# Patient Record
Sex: Male | Born: 1970 | Smoking: Never smoker
Health system: Southern US, Community
[De-identification: ages and names within clinical notes are randomized; demographics above are authoritative.]

## PROBLEM LIST (undated history)

## (undated) DIAGNOSIS — I639 Cerebral infarction, unspecified: Secondary | ICD-10-CM

## (undated) DIAGNOSIS — I5020 Unspecified systolic (congestive) heart failure: Secondary | ICD-10-CM

## (undated) DIAGNOSIS — Z95810 Presence of automatic (implantable) cardiac defibrillator: Secondary | ICD-10-CM

## (undated) DIAGNOSIS — G4700 Insomnia, unspecified: Secondary | ICD-10-CM

## (undated) DIAGNOSIS — E119 Type 2 diabetes mellitus without complications: Secondary | ICD-10-CM

## (undated) DIAGNOSIS — I10 Essential (primary) hypertension: Secondary | ICD-10-CM

## (undated) DIAGNOSIS — N183 Chronic kidney disease, stage 3 unspecified: Secondary | ICD-10-CM

## (undated) DIAGNOSIS — F419 Anxiety disorder, unspecified: Secondary | ICD-10-CM

## (undated) DIAGNOSIS — Z91199 Patient's noncompliance with other medical treatment and regimen due to unspecified reason: Secondary | ICD-10-CM

## (undated) DIAGNOSIS — I429 Cardiomyopathy, unspecified: Secondary | ICD-10-CM

## (undated) DIAGNOSIS — I517 Cardiomegaly: Secondary | ICD-10-CM

## (undated) DIAGNOSIS — I5042 Chronic combined systolic (congestive) and diastolic (congestive) heart failure: Secondary | ICD-10-CM

## (undated) DIAGNOSIS — L709 Acne, unspecified: Secondary | ICD-10-CM

## (undated) DIAGNOSIS — R0602 Shortness of breath: Secondary | ICD-10-CM

## (undated) DIAGNOSIS — Z9119 Patient's noncompliance with other medical treatment and regimen: Secondary | ICD-10-CM

## (undated) DIAGNOSIS — Z8739 Personal history of other diseases of the musculoskeletal system and connective tissue: Secondary | ICD-10-CM

## (undated) DIAGNOSIS — R519 Headache, unspecified: Secondary | ICD-10-CM

## (undated) HISTORY — DX: Chronic kidney disease, stage 3 unspecified: N18.30

## (undated) HISTORY — DX: Acne, unspecified: L70.9

## (undated) HISTORY — DX: Chronic combined systolic (congestive) and diastolic (congestive) heart failure: I50.42

## (undated) HISTORY — DX: Essential (primary) hypertension: I10

## (undated) HISTORY — DX: Unspecified systolic (congestive) heart failure: I50.20

## (undated) HISTORY — DX: Insomnia, unspecified: G47.00

## (undated) HISTORY — DX: Patient's noncompliance with other medical treatment and regimen due to unspecified reason: Z91.199

## (undated) HISTORY — DX: Presence of automatic (implantable) cardiac defibrillator: Z95.810

## (undated) HISTORY — DX: Personal history of other diseases of the musculoskeletal system and connective tissue: Z87.39

## (undated) HISTORY — DX: Cardiomyopathy, unspecified: I42.9

## (undated) HISTORY — DX: Cardiomegaly: I51.7

## (undated) HISTORY — DX: Shortness of breath: R06.02

## (undated) HISTORY — DX: Anxiety disorder, unspecified: F41.9

## (undated) HISTORY — DX: Type 2 diabetes mellitus without complications: E11.9

## (undated) HISTORY — DX: Patient's noncompliance with other medical treatment and regimen: Z91.19

---

## 2017-06-25 DIAGNOSIS — N183 Chronic kidney disease, stage 3 unspecified: Secondary | ICD-10-CM | POA: Insufficient documentation

## 2017-06-25 DIAGNOSIS — I10 Essential (primary) hypertension: Secondary | ICD-10-CM | POA: Insufficient documentation

## 2017-06-25 DIAGNOSIS — Z8739 Personal history of other diseases of the musculoskeletal system and connective tissue: Secondary | ICD-10-CM | POA: Insufficient documentation

## 2017-06-25 DIAGNOSIS — E119 Type 2 diabetes mellitus without complications: Secondary | ICD-10-CM | POA: Insufficient documentation

## 2017-07-29 DIAGNOSIS — I517 Cardiomegaly: Secondary | ICD-10-CM | POA: Insufficient documentation

## 2017-07-29 DIAGNOSIS — Z91199 Patient's noncompliance with other medical treatment and regimen due to unspecified reason: Secondary | ICD-10-CM | POA: Insufficient documentation

## 2017-07-29 DIAGNOSIS — Z9119 Patient's noncompliance with other medical treatment and regimen: Secondary | ICD-10-CM | POA: Insufficient documentation

## 2017-07-29 DIAGNOSIS — R0602 Shortness of breath: Secondary | ICD-10-CM | POA: Insufficient documentation

## 2017-08-19 DIAGNOSIS — I429 Cardiomyopathy, unspecified: Secondary | ICD-10-CM | POA: Insufficient documentation

## 2017-08-19 DIAGNOSIS — I5022 Chronic systolic (congestive) heart failure: Secondary | ICD-10-CM | POA: Insufficient documentation

## 2018-01-27 DIAGNOSIS — I5042 Chronic combined systolic (congestive) and diastolic (congestive) heart failure: Secondary | ICD-10-CM | POA: Insufficient documentation

## 2018-03-10 ENCOUNTER — Ambulatory Visit: Payer: Self-pay | Admitting: "Endocrinology

## 2018-04-05 ENCOUNTER — Ambulatory Visit: Payer: Self-pay | Admitting: "Endocrinology

## 2018-06-02 ENCOUNTER — Ambulatory Visit: Payer: Self-pay | Admitting: "Endocrinology

## 2020-05-09 HISTORY — PX: OTHER SURGICAL HISTORY: SHX169

## 2020-06-18 ENCOUNTER — Encounter: Payer: Self-pay | Admitting: *Deleted

## 2020-06-18 DIAGNOSIS — L708 Other acne: Secondary | ICD-10-CM | POA: Insufficient documentation

## 2020-06-18 DIAGNOSIS — I10 Essential (primary) hypertension: Secondary | ICD-10-CM | POA: Insufficient documentation

## 2020-06-18 DIAGNOSIS — F419 Anxiety disorder, unspecified: Secondary | ICD-10-CM | POA: Insufficient documentation

## 2020-06-18 DIAGNOSIS — G4700 Insomnia, unspecified: Secondary | ICD-10-CM | POA: Insufficient documentation

## 2020-06-21 ENCOUNTER — Institutional Professional Consult (permissible substitution): Payer: Self-pay | Admitting: Student

## 2020-08-14 NOTE — Progress Notes (Unsigned)
Electrophysiology Office Note Date: 08/15/2020  ID:  Phillip Ruiz, DOB 05/17/1971, MRN [id number]  PCP: No primary care provider on file. Primary Cardiologist: [facility] Electrophysiologist: New  CC: Routine ICD follow-up  Phillip Ruiz is a 50 y.o. male seen today for consultation for Barostim Consideration. The patient reports doing overall OK. He is SOB with ADLs including dressing and bathing. He has moderate SOB walking around a pharmacy or a grocery store. Volume status overall well controlled.  he denies chest pain, palpitations, PND, nausea, vomiting, dizziness, syncope, weight gain, or early satiety. He has not had ICD shocks.   He was last seen at [facility] 08/07/20. Entresto was increased. He was described as having NYHA class II dyspnea. Spironolactone and SGLT2 inhibitors are being considered based on follow up labwork.   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 allopurinol (ZYLOPRIM) 100 MG tablet Take 200 mg by mouth daily.    Marland Kitchen amLODipine (NORVASC) 10 MG tablet Take 10 mg by mouth daily.    Marland Kitchen aspirin 81 MG EC tablet Take 81 mg by mouth daily.    . calcitRIOL (ROCALTROL) 0.5 MCG capsule Take 0.25 mcg by mouth in the morning and at bedtime.    . carvedilol (COREG) 25 MG tablet Take 25 mg by mouth in the morning and at bedtime.    Marland Kitchen ENTRESTO 49-51 MG Take 1 tablet by mouth 2 (two) times daily.    . ergocalciferol (VITAMIN D2) 1.25 MG (50000 UT) capsule Vitamin D2 1,250 mcg (50,000 unit) capsule  TAKE 1 CAPSULE BY MOUTH WEEKLY    . minoxidil (LONITEN) 2.5 MG tablet 205 mg in the morning and at bedtime.    . Multiple Vitamin (MULTIVITAMIN PO) Take  1,000 Units by mouth daily.    Marland Kitchen torsemide (DEMADEX) 20 MG tablet Take 20 mg by mouth in the morning and at bedtime.     No current facility-administered medications for this visit.    Allergies:   Patient has no known allergies.   Social History: Social History   Socioeconomic History  . Marital status: Divorced    Spouse name: Not on file  . Number of children: Not on file  . Years of education: Not on file  . Highest education level: Not on file  Occupational History  . Not on file  Tobacco Use  . Smoking status: Never Smoker  . Smokeless tobacco: Never Used  Substance and Sexual Activity  . Alcohol use: Not on file  . Drug use: Not on file  . Sexual activity: Not on file  Other Topics Concern  . Not on file  Social History Narrative  . Not on file   Social Determinants of Health   Financial Resource Strain: Not on file  Food Insecurity: Not on file  Transportation Needs: Not on file  Physical Activity: Not on file  Stress: Not on file  Social Connections: Not on file  Intimate Partner Violence: Not on file    Family History: Father with strokes Mother with Heart Failure   Review of Systems: All other systems reviewed and are otherwise negative except as noted above.   Physical Exam: Vitals:   08/15/20 1248  BP: 132/84  Pulse:  73  SpO2: 97%  Weight: 196 lb 6.4 oz (89.1 kg)  Height: 5\' 8"  (1.727 m)     GEN- The patient is well appearing, alert and oriented x 3 today.   HEENT: normocephalic, atraumatic; sclera clear, conjunctiva pink; hearing intact; oropharynx clear; neck supple, no JVP Lymph- no cervical lymphadenopathy Lungs- Clear to ausculation bilaterally, normal work of breathing.  No wheezes, rales, rhonchi Heart- Regular rate and rhythm, no murmurs, rubs or gallops, PMI not laterally displaced GI- soft, non-tender, non-distended, bowel sounds present, no hepatosplenomegaly Extremities- no clubbing or cyanosis. No edema; DP/PT/radial pulses  2+ bilaterally MS- no significant deformity or atrophy Skin- warm and dry, no rash or lesion; ICD pocket well healed Psych- euthymic mood, full affect Neuro- strength and sensation are intact  EKG:  EKG is ordered today. The ekg ordered today shows NSR at 73 bpm with QRS 94 ms  Recent Labs: No results found for requested labs within last 8760 hours.   Wt Readings from Last 3 Encounters:  08/15/20 196 lb 6.4 oz (89.1 kg)     Other studies Reviewed: Additional studies/ records that were reviewed today include: Notes in care everywhere from [facility] cardiology.    Assessment and Plan:  1.  Chronic systolic dysfunction s/p [facility] dual chamber ICD  euvolemic today Stable on current regimen, and primary cardiologist is actively titrating GDMT.  Normal ICD function Note plans for repeat Echo Stress test 06/2017 with no ischemia, low EF, stopped due to dyspnea.   2. CKD III Has limited medications to a degree.  If spironolactone is deferred due to this, he will still be able to proceed with Barostim consideration.   Phillip Ruiz's heart failure has failed to improve despite titration of guideline directed medication such that he qualifies for the [facility] NEO device. The following information from the patient's medical record supports the medical necessity of this procedure for my patient, consistent with the FDA on-label indication for BAROSTIM NEO:  ? LVEF of 25-30%  confirmed by Echo on 03/2018. Note recent note with plans to update s/p GDMT.    ? NT-proBNP of <1600 pg/ml  = Labs to be drawn today, 08/14/20  ?Symptomatic despite medication management of: diuretic, beta blocker and ACEi/ARB/ARNi as evidenced by symptoms below. He is being considered for spironolactone and SGLT2 inhibitor based on follow up labs.   ? This patients signs and symptoms of heart failure include "dyspnea with mild to moderate exertion, orthopnea, edema, fatigue and dizziness   ?  NYHA Congestive Heart Failure Classification: III  ? Recent hospitalization for Heart Failure on (not applicable for this patient)   This patient is NOT indicated for cardiac resynchronization therapy because QRS = 94 ms by EKG 08/15/20    Current medicines are reviewed at length with the patient today.   The patient does not have concerns regarding his medicines.  The following changes were made today:  none  Labs/ tests ordered today include:  Orders Placed This Encounter  Procedures  . Basic metabolic panel  . Pro b natriuretic peptide (BNP)  . EKG 12-Lead   Pt needs in order to proceed with barostim consideration: GDMT optimization (mainly trial of spiro OR decision to forego spiro in the setting of his kidney disease) Repeat echo once on GDMT ProBNP (ordered today, but if out of range can be repeated post GDMT). Carotid assessment (If he is not excluded by repeat Echo or ProBNP)    Disposition:   Follow up  with EP APP  4-6 weeks for further barostim consideration and planning.     Dustin Flock, PA-C  08/15/2020 1:28 PM  [facility] [address] [place] [address] [phone] (office) [phone] (fax)

## 2020-08-15 ENCOUNTER — Encounter: Payer: Self-pay | Admitting: Student

## 2020-08-15 ENCOUNTER — Other Ambulatory Visit: Payer: Self-pay

## 2020-08-15 ENCOUNTER — Ambulatory Visit (INDEPENDENT_AMBULATORY_CARE_PROVIDER_SITE_OTHER): Payer: Medicare Other | Admitting: Student

## 2020-08-15 VITALS — BP 132/84 | HR 73 | Ht 68.0 in | Wt 196.4 lb

## 2020-08-15 DIAGNOSIS — I5022 Chronic systolic (congestive) heart failure: Secondary | ICD-10-CM | POA: Diagnosis not present

## 2020-08-15 DIAGNOSIS — I10 Essential (primary) hypertension: Secondary | ICD-10-CM

## 2020-08-15 DIAGNOSIS — N183 Chronic kidney disease, stage 3 unspecified: Secondary | ICD-10-CM

## 2020-08-15 NOTE — Patient Instructions (Signed)
Medication Instructions:  Your physician recommends that you continue on your current medications as directed. Please refer to the Current Medication list given to you today.  *If you need a refill on your cardiac medications before your next appointment, please call your pharmacy*   Lab Work: TODAY: BMET, ProBNP  If you have labs (blood work) drawn today and your tests are completely normal, you will receive your results only by: Marland Kitchen MyChart Message (if you have MyChart) OR . A paper copy in the mail If you have any lab test that is abnormal or we need to change your treatment, we will call you to review the results.   Follow-Up: At [facility], you and your health needs are our priority.  As part of our continuing mission to provide you with exceptional heart care, we have created designated Provider Care Teams.  These Care Teams include your primary Cardiologist (physician) and Advanced Practice Providers (APPs -  Physician Assistants and Nurse Practitioners) who all work together to provide you with the care you need, when you need it.  We recommend signing up for the patient portal called "MyChart".  Sign up information is provided on this After Visit Summary.  MyChart is used to connect with patients for Virtual Visits (Telemedicine).  Patients are able to view lab/test results, encounter notes, upcoming appointments, etc.  Non-urgent messages can be sent to your provider as well.   To learn more about what you can do with MyChart, go to [email or web address].    Your next appointment:   09/12/2020  The format for your next appointment:   In Person  Provider:   Casimiro Needle "Mardelle Matte" Lanna Poche, PA-C   Other Instructions Your Dr's are considering trying you on SPIRONOLACTONE

## 2020-08-16 LAB — BASIC METABOLIC PANEL
BUN/Creatinine Ratio: 10 (ref 9–20)
BUN: 16 mg/dL (ref 6–24)
CO2: 22 mmol/L (ref 20–29)
Calcium: 10 mg/dL (ref 8.7–10.2)
Chloride: 104 mmol/L (ref 96–106)
Creatinine, Ser: 1.66 mg/dL — ABNORMAL HIGH (ref 0.76–1.27)
GFR calc Af Amer: 54 mL/min/{1.73_m2} — ABNORMAL LOW (ref >59)
GFR calc non Af Amer: 47 mL/min/{1.73_m2} — ABNORMAL LOW (ref >59)
Glucose: 99 mg/dL (ref 65–99)
Potassium: 3.9 mmol/L (ref 3.5–5.2)
Sodium: 144 mmol/L (ref 134–144)

## 2020-08-16 LAB — PRO B NATRIURETIC PEPTIDE: NT-Pro BNP: 560 pg/mL — ABNORMAL HIGH (ref 0–121)

## 2020-09-12 ENCOUNTER — Telehealth: Payer: Medicare Other | Admitting: Student

## 2020-09-24 ENCOUNTER — Other Ambulatory Visit: Payer: Self-pay

## 2020-09-24 ENCOUNTER — Telehealth (INDEPENDENT_AMBULATORY_CARE_PROVIDER_SITE_OTHER): Payer: Medicare Other | Admitting: Student

## 2020-09-24 DIAGNOSIS — I5022 Chronic systolic (congestive) heart failure: Secondary | ICD-10-CM

## 2020-09-24 NOTE — Progress Notes (Signed)
Virtual Visit via Telephone Note   This visit type was conducted due to national recommendations for restrictions regarding the COVID-19 Pandemic (e.g. social distancing) in an effort to limit this patient's exposure and mitigate transmission in our community.  Due to his co-morbid illnesses, this patient is at least at moderate risk for complications without adequate follow up.  This format is felt to be most appropriate for this patient at this time.  The patient did not have access to video technology/had technical difficulties with video requiring transitioning to audio format only (telephone).  All issues noted in this document were discussed and addressed.  No physical exam could be performed with this format.  Please refer to the patient's chart for his  consent to telehealth for [facility].  The patient was identified using 2 identifiers.   PCP:  No primary care provider on file. Primary Cardiologist: [facility]. Electrophysiologist: New to [facility] pending barostim procedure.  Phillip Ruiz is a 50 y.o. male seen today for ongoing consultation for barostim consideration for routine follow up to continue discussing plan for barostim.  Since last being seen in our clinic the patient reports doing overall well, and about the same. He remains SOB with moderate exertion, including SOB walking around a grocery store.  he denies chest pain, palpitations, PND, orthopnea, nausea, vomiting, dizziness, syncope, edema, weight gain, or early satiety.  Past Medical History:  Diagnosis Date  . Acne    Nec  . Anxiety   . Cardiomyopathy (HCC)   . Chronic combined systolic and diastolic congestive heart failure, NYHA class 3 (HCC)   . CKD (chronic kidney disease), stage III (HCC)   . DM type 2 (diabetes mellitus, type 2) (HCC)   . HFrEF (heart failure with reduced ejection fraction) (HCC)   . History of gout   . Hypertension, essential, benign   . ICD (implantable  cardioverter-defibrillator) in place   . Insomnia    Nec  . LVH (left ventricular hypertrophy)   . Poor compliance   . SOB (shortness of breath) on exertion   . Uncontrolled hypertension      Current Outpatient Medications  Medication Sig Dispense Refill  . allopurinol (ZYLOPRIM) 100 MG tablet Take 200 mg by mouth daily.    Marland Kitchen amLODipine (NORVASC) 10 MG tablet Take 10 mg by mouth daily.    Marland Kitchen aspirin 81 MG EC tablet Take 81 mg by mouth daily.    . calcitRIOL (ROCALTROL) 0.5 MCG capsule Take 0.25 mcg by mouth in the morning and at bedtime.    . carvedilol (COREG) 25 MG tablet Take 25 mg by mouth in the morning and at bedtime.    Marland Kitchen ENTRESTO 49-51 MG Take 1 tablet by mouth 2 (two) times daily.    . ergocalciferol (VITAMIN D2) 1.25 MG (50000 UT) capsule Vitamin D2 1,250 mcg (50,000 unit) capsule  TAKE 1 CAPSULE BY MOUTH WEEKLY    . minoxidil (LONITEN) 2.5 MG tablet 205 mg in the morning and at bedtime.    . Multiple Vitamin (MULTIVITAMIN PO) Take 1,000 Units by mouth daily.    Marland Kitchen torsemide (DEMADEX) 20 MG tablet Take 20 mg by mouth in the morning and at bedtime.     No current facility-administered medications for this visit.    No Known Allergies  Social History   Socioeconomic History  . Marital status: Divorced    Spouse name: Not on file  . Number of children: Not on file  . Years  of education: Not on file  . Highest education level: Not on file  Occupational History  . Not on file  Tobacco Use  . Smoking status: Never Smoker  . Smokeless tobacco: Never Used  Vaping Use  . Vaping Use: Never used  Substance and Sexual Activity  . Alcohol use: Never  . Drug use: Never  . Sexual activity: Not on file  Other Topics Concern  . Not on file  Social History Narrative  . Not on file   Social Determinants of Health   Financial Resource Strain: Not on file  Food Insecurity: Not on file  Transportation Needs: Not on file  Physical Activity: Not on file  Stress: Not on file   Social Connections: Not on file  Intimate Partner Violence: Not on file   Review of Systems: All other systems reviewed and are otherwise negative except as noted above.  Physical Exam: Pt unable to take vitals today.  GEN- The patient is well appearing, alert and oriented x 3 today.   HEENT: normocephalic, atraumatic; sclera clear, conjunctiva pink; hearing intact; oropharynx clear; neck supple, no JVP Lymph- no cervical lymphadenopathy Lungs- Clear to ausculation bilaterally, normal work of breathing.  No wheezes, rales, rhonchi Heart- Regular rate and rhythm, no murmurs, rubs or gallops, PMI not laterally displaced GI- soft, non-tender, non-distended, bowel sounds present, no hepatosplenomegaly Extremities- no clubbing, cyanosis, or edema; DP/PT/radial pulses 2+ bilaterally MS- no significant deformity or atrophy Skin- warm and dry, no rash or lesion Psych- euthymic mood, full affect Neuro- strength and sensation are intact  EKG is not ordered.   Additional studies reviewed include: Previous EP notes and CareEverywhere  Assessment and Plan:  1. Chronic systolic CHF Pts GDMT has been titrated and plans for repeat Echo per primary team in 11/13/2020. We will follow up by phone again after this.  If EF remains < 35%, will proceed with barostim work up. If >40% will have to defer for the time being. Discussed with patient today.   Graciella Freer, PA-C  09/24/20 9:36 AM

## 2020-11-19 ENCOUNTER — Other Ambulatory Visit: Payer: Self-pay

## 2020-11-19 ENCOUNTER — Telehealth (INDEPENDENT_AMBULATORY_CARE_PROVIDER_SITE_OTHER): Payer: Medicare Other | Admitting: Student

## 2020-11-19 DIAGNOSIS — N183 Chronic kidney disease, stage 3 unspecified: Secondary | ICD-10-CM

## 2020-11-19 DIAGNOSIS — I5022 Chronic systolic (congestive) heart failure: Secondary | ICD-10-CM | POA: Diagnosis not present

## 2020-11-19 NOTE — Progress Notes (Addendum)
Virtual Visit via Telephone Note   This visit type was conducted due to national recommendations for restrictions regarding the COVID-19 Pandemic (e.g. social distancing) in an effort to limit this patient's exposure and mitigate transmission in our community.  Due to his co-morbid illnesses, this patient is at least at moderate risk for complications without adequate follow up.  This format is felt to be most appropriate for this patient at this time.  The patient did not have access to video technology/had technical difficulties with video requiring transitioning to audio format only (telephone).  All issues noted in this document were discussed and addressed.  No physical exam could be performed with this format.  Please refer to the patient's chart for his  consent to telehealth for [facility].  The patient was identified using 2 identifiers.   PCP:  No primary care provider on file. Primary Cardiologist: [facility]. Electrophysiologist: New to [facility] pending barostim procedure.  Provider location: [facility] Patient location: His home  Phillip Ruiz is a 50 y.o. male seen today for ongoing consultation for barostim consideration.  Since last visit, pt had medications titrated and had a repeat Echo that shows EF remains low at 20-25% range.  He does pretty well "for the first half of the day" and then pretty wiped out for the rest of the day.  He continues to have issues with his blood pressure running high. Recently started back on amlodipine. BP running as high as 180 systolic and 110s diastolic at times. He has mild SOB doing anything more than just "walking around the house." He denies chest pains, palpitations, PND, orthopnea, nausea, vomiting, dizziness, syncope, or edema.  He is very interested in moving forward with anything that could make him feel better.   Past Medical History:  Diagnosis Date  . Acne    Nec  . Anxiety   .  Cardiomyopathy (HCC)   . Chronic combined systolic and diastolic congestive heart failure, NYHA class 3 (HCC)   . CKD (chronic kidney disease), stage III (HCC)   . DM type 2 (diabetes mellitus, type 2) (HCC)   . HFrEF (heart failure with reduced ejection fraction) (HCC)   . History of gout   . Hypertension, essential, benign   . ICD (implantable cardioverter-defibrillator) in place   . Insomnia    Nec  . LVH (left ventricular hypertrophy)   . Poor compliance   . SOB (shortness of breath) on exertion   . Uncontrolled hypertension      Current Outpatient Medications  Medication Sig Dispense Refill  . allopurinol (ZYLOPRIM) 100 MG tablet Take 200 mg by mouth daily.    Marland Kitchen amLODipine (NORVASC) 10 MG tablet Take 10 mg by mouth daily.    Marland Kitchen aspirin 81 MG EC tablet Take 81 mg by mouth daily.    . calcitRIOL (ROCALTROL) 0.5 MCG capsule Take 0.25 mcg by mouth in the morning and at bedtime.    . carvedilol (COREG) 25 MG tablet Take 25 mg by mouth in the morning and at bedtime.    Marland Kitchen ENTRESTO 49-51 MG Take 1 tablet by mouth 2 (two) times daily.    . ergocalciferol (VITAMIN D2) 1.25 MG (50000 UT) capsule Vitamin D2 1,250 mcg (50,000 unit) capsule  TAKE 1 CAPSULE BY MOUTH WEEKLY    . minoxidil (LONITEN) 2.5 MG tablet 205 mg in the morning and at bedtime.    . Multiple Vitamin (MULTIVITAMIN PO) Take 1,000 Units by mouth daily.    Marland Kitchen  torsemide (DEMADEX) 20 MG tablet Take 20 mg by mouth in the morning and at bedtime.     No current facility-administered medications for this visit.    No Known Allergies  Social History   Socioeconomic History  . Marital status: Divorced    Spouse name: Not on file  . Number of children: Not on file  . Years of education: Not on file  . Highest education level: Not on file  Occupational History  . Not on file  Tobacco Use  . Smoking status: Never Smoker  . Smokeless tobacco: Never Used  Vaping Use  . Vaping Use: Never used  Substance and Sexual Activity   . Alcohol use: Never  . Drug use: Never  . Sexual activity: Not on file  Other Topics Concern  . Not on file  Social History Narrative  . Not on file   Social Determinants of Health   Financial Resource Strain: Not on file  Food Insecurity: Not on file  Transportation Needs: Not on file  Physical Activity: Not on file  Stress: Not on file  Social Connections: Not on file  Intimate Partner Violence: Not on file   Review of Systems: Review of systems complete and found to be negative unless listed in HPI.    Exam: BP at home running as high as 160-180s at times. Diastolic values 90-110s at times. Amlodipine recently added back   Well sounding on phone. Answers questions and holds conversation without any notable distress or SOB.   EKG is not ordered. EKG 08/15/20 with NSR at 73 bpm, QRS 93 ms  Additional studies reviewed include: Previous EP notes and CareEverywhere again reviewed with updated Echo 10/2020  Assessment and Plan:  1. Chronic systolic CHF Echo 11/13/2020 with LVEF 20-25% range Pts GDMT has been titrated and EF remains <35%.  Pt continues with NYHA III symptoms and is thus candidate for consideration of neurohormonal modulation with Barostim or enrollment in [place] or ANTHEM.  Pt is on coreg and Entresto, and spiro is being avoided due to renal function I have recommended he follow up with his CHF team to consider BiDIL as well. I believe he would benefit from assessment by our research team to consolidate his options and work up as to not require multiple trips down to [place] from [place].  He has a narrow QRS and is not a candidate for CRT therapy.  2. CKD III This has precluded consideration of spironolactone at this time.  Bidil would also be a great option for this patient.   Will send to research for consideration of BATWIRE, commercial, or other depending on candidacy.   I spent 12 minutes with this patient over the telephone discussing the above.  Graciella Freer, PA-C  11/19/20 11:22 AM

## 2020-11-28 ENCOUNTER — Telehealth: Payer: Self-pay | Admitting: *Deleted

## 2020-11-28 DIAGNOSIS — I5022 Chronic systolic (congestive) heart failure: Secondary | ICD-10-CM

## 2020-11-28 DIAGNOSIS — Z006 Encounter for examination for normal comparison and control in clinical research program: Secondary | ICD-10-CM

## 2020-11-28 NOTE — Telephone Encounter (Signed)
Patient interested in [place] study.  Will set up patient for appt, he will check his schedule and call back.

## 2020-12-12 ENCOUNTER — Other Ambulatory Visit: Payer: Self-pay | Admitting: *Deleted

## 2020-12-12 DIAGNOSIS — Z006 Encounter for examination for normal comparison and control in clinical research program: Secondary | ICD-10-CM

## 2020-12-17 NOTE — Telephone Encounter (Signed)
Patietn called back will move forward with screening process.  Pt to come in 12/18/2020 @ 1030 ICF has been emailed to patient along with website information.   Orders placed.

## 2020-12-18 ENCOUNTER — Other Ambulatory Visit: Payer: Self-pay

## 2020-12-18 ENCOUNTER — Other Ambulatory Visit: Payer: Self-pay | Admitting: *Deleted

## 2020-12-18 ENCOUNTER — Ambulatory Visit (HOSPITAL_BASED_OUTPATIENT_CLINIC_OR_DEPARTMENT_OTHER)
Admission: RE | Admit: 2020-12-18 | Discharge: 2020-12-18 | Disposition: A | Discharge status: Home / Self Care | Payer: Medicare Other | Source: Ambulatory Visit | Attending: Internal Medicine | Admitting: Internal Medicine

## 2020-12-18 ENCOUNTER — Ambulatory Visit (HOSPITAL_COMMUNITY)
Admission: RE | Admit: 2020-12-18 | Discharge: 2020-12-18 | Disposition: A | Discharge status: Home / Self Care | Payer: Medicare Other | Source: Ambulatory Visit | Attending: Internal Medicine | Admitting: Internal Medicine

## 2020-12-18 ENCOUNTER — Encounter: Payer: Medicare Other | Admitting: *Deleted

## 2020-12-18 VITALS — BP 146/97 | HR 78 | Temp 98.4°F | Resp 16 | Ht 68.0 in | Wt 190.0 lb

## 2020-12-18 DIAGNOSIS — Z006 Encounter for examination for normal comparison and control in clinical research program: Secondary | ICD-10-CM

## 2020-12-18 DIAGNOSIS — N189 Chronic kidney disease, unspecified: Secondary | ICD-10-CM | POA: Insufficient documentation

## 2020-12-18 DIAGNOSIS — E1122 Type 2 diabetes mellitus with diabetic chronic kidney disease: Secondary | ICD-10-CM | POA: Insufficient documentation

## 2020-12-18 DIAGNOSIS — I5022 Chronic systolic (congestive) heart failure: Secondary | ICD-10-CM | POA: Insufficient documentation

## 2020-12-18 DIAGNOSIS — Z95810 Presence of automatic (implantable) cardiac defibrillator: Secondary | ICD-10-CM | POA: Insufficient documentation

## 2020-12-18 DIAGNOSIS — Z01810 Encounter for preprocedural cardiovascular examination: Secondary | ICD-10-CM | POA: Insufficient documentation

## 2020-12-18 NOTE — Research (Addendum)
Section A:  Administrative Section  Subject ID: _[id number]_ - _021_ - 015 Subject Initials: _V_ _A_ _H_  Date subject signed informed consent  _21_/_JUN_/_2022_  (DD / MMM / YYYY)  Has the subject been previously screened?    _0  No     _1   Yes                                                         Previous Subject ID _1 _2 _4 _5_ - __ __ __ - 015   Section B:  Enrollment Criteria  In the investigator's opinion does the subject meet the FDA Indication for Use:  _2  Yes    _3  No (STOP - subject does not qualify for the study)  Has the subject been previously, or are currently, randomized in the CVRx BeAT-HF Trial? _4  Yes (STOP - subject does not qualify for the study)   _5  No  Has the subject received cardiac resynchronization therapy (CRT) within six months of enrollment, or is actively receiving CRT? _6  Yes (STOP - subject does not qualify for the study)   _7  No  Section C:  Serum Biomarkers   NT-proBNP: ____914____________ Date:    _21_/_JUN_/_2022_              (DD / MMM / YYYY)  eGFR:  __54____ mL/min/1.76m Date:    _21_/_JUN_/_2022_             (DD / MMM / YYYY)  Negative Pregnancy Test? _8  N/A Date:    _21_/_JUN_/_2022_             (DD / MMM / YYYY)   _9  Yes     _10  No   Section D:  Appropriate Surgical/Study Candidate:   Is the subject able to discontinue the use of antiplatelet drugs (e.g. aspirin) in advance of the procedure, if required? _11  Yes   _12  No  Assessed by:  _Annamarie Major MD__ Implanting Physician _13  Yes Date:    _21_/_JUN_/_2022_             (DD / MMM / YYYY)   _14  No   Section E:  Inclusion/Exclusion Criteria  Does the subject meet all Inclusion Criteria? _15  Yes   _16  No (STOP - subject does not qualify for the study)   Check all that apply   _17  Age at least 21 years and no more than 80 years at the time of enrollment.   _18  Appropriate candidate for the surgery as determined by an evaluation from the implanting physician using a carotid duplex ultrasound  (CDU) [or Computed Tomography Angiography (CTA) if CDU inconclusive or incomplete] and a review of medical history (including existence of infections that may increase implant risk).  Evaluation must confirm the following within 45 days of the BAROSTIM NEO implant: Appropriate medical condition and medical history for implantation of the BAROSTIM NEO System AND Anatomy that enables this implant procedure, with no vascular structures or orientations or neck anomalies that would be obstructive to the implantation path AND The artery planned for the BAROSTIM NEO implant must have: A carotid bifurcation below the level of the mandible AND No ulcerative carotid arterial plaques AND No carotid atherosclerosis producing a 30% or greater stenosis in linear diameter in the internal carotid AND No carotid atherosclerosis producing a 30% or greater  stenosis in linear diameter in the distal common carotid AND Have had no prior surgery, radiation, or endovascular stent placement in the carotid artery or the carotid sinus region AND Able to discontinue the use of antiplatelet drugs (e.g. aspirin) in advance of the procedure, if required   _0  Six-minute hall walk (6MHW) ? 150 m AND ? 400 m within 45 days prior to implant.   _1  Serum estimated glomerular filtration rate (eGFR) ? 25 mL/min/1.73 m2 using the CKD-EPI method within 45 days prior to the [facility] NEO implant.   _2  Body mass index ? 40 kg/m2 within 45 days prior to the [facility] NEO implant.   _3  If male and of childbearing potential, must use a medically accepted method of birth control (e.g., barrier method with spermicide, oral contraceptive, or abstinence) and agree to continue use of this method for the duration of the study. Women of childbearing potential must have a negative pregnancy test within 14 days prior to the [facility] NEO implant.   _4  Subjects implanted with a cardiac rhythm management device that does not utilize an intracardiac lead, or  implanted with a neurostimulation device, must be approved by the [place].   _5  At the end of screening/baseline, subject still meets the Barostim Neo Indication for Use   _6  Signed a CVRx-approved informed consent form for participation in this study.      Does the subject meet any Exclusion Criteria? _7  Yes (STOP - subject does not qualify for the study)   _8  No     List criteria # met: __________________________   _9  Previously or currently randomized in the CVRx BeAT-HF Trial.   _10  Received cardiac resynchronization therapy (CRT) within six months of enrollment or is actively receiving CRT.   _11  Any of the following contraindications: Baroreflex failure or autonomic neuropathy  Uncontrolled, symptomatic cardiac bradyarrhythmias Known allergy to silicone or titanium   _12  Unstable ventricular arrhythmias.   _13  Presence of baseline cranial nerve dysfunction at risk from cervical interventions on the carotid bifurcation determined by the Ear, Nose and Throat (ENT) examination.   _14  Subjects with any surgery that has occurred, or is planned to occur, within 45 days of the [facility] NEO implant.   _15  Recent history (within 6 months of implant) of significant and uncontrolled bleeding.   _16  Known and untreated hypercoagulability state.   _17  An inappropriate study candidate as evidenced by: Solid organ or hematologic transplant, or currently being evaluated for an organ transplant. Has received or is receiving LVAD therapy or chronic dialysis. Current or planned treatment with intravenous positive inotrope therapy. Primary pulmonary hypertension. Severe COPD or severe restrictive lung disease (e.g. requires chronic oral steroid use or home oxygen use).  Heart failure secondary to a reversible cause, such as cardiac structural valvular disease, acute myocarditis and pericardial constriction. Clinically significant cardiac structural valvular disease.  Unable or unwilling to  fulfill the protocol medication compliance and follow-up requirements, for reasons including but not limited to an unresolved history of alcohol or substance abuse or psychiatric disorder. Active malignancy.  Any other serious medical condition that may adversely affect the safety of the participant or validity of the study, in the opinion of the investigator. Life expectancy less than one year.   _18  Any of the following within 3 months prior to the [facility] NEO implant. Myocardial infarction Unstable angina Coronary intervention (e.g. CABG or PTCA)  Cerebral vascular accident or transient ischemic attack Sudden cardiac death  Surgical cardiac intervention (e.g. cardiac  ablation, valve replacement)   _0  Enrolled and active in another (e.g. device, pharmaceutical, or biological) clinical study unless approved by the [place].  Section F:  Adverse Events  Were there any Adverse Events that occurred since consent? _1  Yes (complete AE form)   _2  No  Section H:  Medication Changes   Have there been any changes to the subject's home use medications for Arrhythmia, Antiplatelet/Anticoagulation and Heart failure medications during screening and baseline? _3  Yes (update Med form)   _4  No  Section I:  Signature   Person completing form (Print) Name: [name] :)________________  Signature: [name] :) __ Date: __06/24/2022________________________    SECTION A:  Administrative Section  Patient ID: _[id number]_ - _021_ - 015 Patient Initials: _V_ _A_ _H_  Visit Interval:    _5  Screening/Baseline*    _6  Activation   _7  0.5 Month       _8  1 Month     _9  2 Month     _10  3 Month       _11  6 Month     _12  12 Month         _13  Unscheduled, reason for visit: _________________________________________  * For Screening / Baseline only the questions are based on 30 days prior to consent.  SECTION B:  COVID-19 Like Illness Symptoms   Has the subject experienced any cold, flu or  COVID-19 symptoms since the last study visit? _14  No  (skip to section C)     _15  Yes, date of onset:  _____/_____ MMM/YYYY    If yes, check all symptoms that apply:   _16  Fevers or chills   _17  New or Worsening Cough  _18  Productive  _19  Dry    _20  If yes to cough, indicate severity  _21  Constant  _22  Occasional, several per hour   _23  New or worsened shortness of breath   _24  Diarrhea   _25  Altered or reduced sense of smell or taste   _26  Muscle aches/Severe Fatigue   _27  Chest pain or tightness   _28  Sore throat   _29  Nausea or vomiting  SECTION C:  COVID-19 Like Illness Testing   Has the patient been tested for COVID-19 since the last study visit? _30  No     _31  Yes, date of test:  _____/_____ MMM/YYYY    If yes, test results:   _32  Positive _33  Negative _34  Unknown  Has the patient been tested for COVID-19 Antibodies since the last study visit? _35  No     _36  Yes, date of test:  _____/_____ MMM/YYYY    If yes, test results:   _37  Positive _38  Negative _39  Unknown  Has the patient been vaccinated for COVID-19 since the last study visit? _40  No     _41  Yes, date of test:   _28_ /_Apr_/_2021_ DD/MMM/YYYY  Has the patient been tested for Influenza ("flu") since the last study visit? _42  No     _43  Yes, date of test:  _____/_____ MMM/YYYY    If yes, test results:   _44  Positive _45  Negative _46  Unknown  Has the patient been vaccinated for Influenza ("flu") since the last study visit? _47  No     _48  Yes, date of test:   _10_ / _Nov_/_2021_ DD/MMM/YYYY  SECTION D:  COVID-19 Like Illness Exposure  Has the subject been told that they might have had COVID-19/have symptoms suggestive of COVID-19 since the last study visit? _49  No   _50  Yes   _51  Unknown  Has  the subject been exposed to anyone with known or suspected COVID-19 since the last study visit? _0  No   _1  Yes   _2  Unknown  Has the subject been told that they might have had the "flu" or influenza since the last study visit? _3  No   _4  Yes   _5   Unknown  SECTION E:  Effects of COVID Pandemic on Subject's Healthcare Interactions  Since the last study visit, did the subject feel the need to go to an emergency department or hospital for their heart failure but decided not to because of concerns about COVID-19? _6  No   _7  Yes   _8  Unknown    If yes, how did they seek care (check all that apply):   _9  Telemedicine visit _10  In-person _11  Clinic _12  Urgent Care   _13  Subject did not change hospital/ER use due to COVID-19 _14  Other, specify: ________________________  Since the last study visit, did the subject have a cardiology/HF related appointment cancelled/rescheduled due to COVID-19 pandemic? _15  No   _16  Yes, how many? ___________   _17  Unknown  Since the last study visit, did the subject have any cardiology/HF related telemedicine visit due to COVID-19 pandemic? _18  No   _19  Yes, how many? ___________   _20  Unknown  Since the last study visit, did the subject have a cardiology/HF procedure cancelled/rescheduled due to COVID-19 pandemic? _21  No   _22  Yes, how many? ___________   _23  Unknown  SECTION F:  Effects of COVID Pandemic on Subject's Medications  Since the last visit, did any of their heart failure medications change or stop, even for a short time?   If yes, enter change into medication eCRF _24  No   _25  Yes, how many? ___________   _26  Unknown    If yes, why:   _27  Instructed by Doctor _28  Self-discontinued _29  Unknown  Other: ________________  Since the last visit, was the subject prescribed any medications for COVID-19? _30  No   _31  Yes   _32  Unknown    If yes, what medications (generic name):  SECTION G:  Effects of COVID Pandemic on Subject's Lifestyle  How has the subject's activity/exercise level changed due to COVID-19 pandemic? _33  No change   _34  More activity/exercise   _35  Less activity/exercise  How has the subject's smoking habits changed due to COVID-19? _36  Does not smoke   _37  No change   _38  Smoke more   _39  Smoke less   How has the subject's alcohol drinking habits changed due to COVID-19? _40  Does not drink   _41  No change   _42  Drink more   _43  Drink less  Section H:  Signature   Person completing form (Print Name): [name] :) _____________  Signature: [name]:) ___ Date: _06/21/2022________________     Section A:  Demographic Section  Subject ID: _[id number]_ - _021_ - 015 Subject Initials: _V_ _A_ _H_  Date of Birth: _[birth year]_/_[birth year]_/_1972_      (DD / MMM / [place]) Gender: _44 Male    _45  Male  Ethnicity  _46 Asian    _47 Black/African Descent _48 Middle Eastern        _49 White/Caucasian     _50 Other/Refused   Section B:  Medical History  Type of Cardiomyopathy _51 Ischemic _52 Non-ischemic _53 Other, specify: ___________    Yes No Unknown  Cardiovascular   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 _7       If no, date LV lead turned off________/________ (MMM / YYYY)   PA pressure device (e.g. CardioMEMS) Specify Device: ____________ Implant Date: ________/________ (MMM / [place]) _8  _9    Myocardial Infarction _10  _11  _12    CABG _13  _14  _15    Coronary Intervention _16  _17  _18    Aortic Valve Disease _19  _20  _21    Mitral Valve Disease _22  _23  _24    Left Ventricular Hypertrophy _25  _26  _27   Cardiac Arrhythmia Atrial fibrillation _28  _29  _30    Ventricular Tachycardia/Fibrillation _31  _32  _33    PVC _34  _35  _36    Pacemaker _37  _38  _39    ICD _40  _41  _42    Ablation Procedure Specify: _43 atrial _44  ventricular Last procedure date:         ________/________ (MMM / [place]) _45  _46    Cardioversion Procedure Specify: _47 Shock _48  Meds Last procedure date:         ________/________ (MMM / [place]) _49  _50    Sudden Cardiac Death  _51  _52  _53     Yes No Unknown  Peripheral Vascular Peripheral Vascular Disease _54  _55  _56    PVD Intervention (e.g. stent, angioplasty, etc.). _57  _58  _59   Cerebrovascular / Neurological Stroke _60  _61  _62    TIA _63  _64  _65   Renal / Hepatic Prior Renal Denervation Date of Denervation:        ________/________ (MMM / [place]) _66  _67    Chronic Kidney Disease Stage (1-5): __3______   _68  _69    Chronic Dialysis _70  _71  _72    Kidney Transplant _73  _74  <[id number]>_3<\/[id number]>_02   Metabolic / Nutritional Diabetes Mellitus  _76   Type 1  _77   Type 2 _78  _79    Hypokalemia _80  _81  _82   Other, specify               Section C:  Comments  N/A        Section D:  Signature   Person completing form (Print Name): [name] :) ____________  Signature: [name] :) ____ Date: __06/24/_2022_____________     Section A:  Administrative Section  Subject ID: _[id number]_ - _021_ - 015 Subject Initials: _V_ _A_ _H_  Visit Interval:    _83  Screening/Baseline    _84  Activation   _85  0.5 Month       _86  1 Month     _87  2 Month     _88  3 Month       _89  6 Month     _90  12 Month         _91  Unscheduled, reason for visit: _________________________________________  Section B:  Physical Assessment   Date:    _21_/_Jun_/_2022_   (DD / MMM / YYYY)  Weight: _190_  _92  kg    _93  pounds Height (Screening Visit Only): _68_  _94  cm _95  inches  Blood Pressure: _146_  / _97_mmHg Heart Rate: _78_ bpm  Section E:  Signature    Person completing form (Print Name): [name] :) _  Signature: [name] :) _ Date: _06/24/2022_________    Section A:  Administrative Section   Subject ID: _[id number]_ - _021_ - 015  Subject Initials: _V_ _A_ _H_  Section B:  Carotid Duplex Ultrasound (CDU) _96   Done _97  Not Done  Date: _21_/_Jun_/_2022_ (DD / MMM / YYYY)  _98  Images sent to [facility]  % atherosclerosis in the internal and distal common carotid and  duplex measurements:  Right Side Measurements    Both internal and distal common carotids are  less than 30% _0 Yes   _1   No (cannot be used for implant)   Comments on determination of <30% stenosis:  Internal Carotid (ICA) _2  Normal (no plaque) PSV (peak systolic velocity) _20_ cm/sec   _3  ?25% EDV (end diastolic velocity) _42_ cm/sec   _4  16-49% (*see CTA) Linear diameter _30_ mm   _5  50-69% Normal spectral waveform  _6  Yes   _7  ?70%  _8  No, specify pattern: __________________  Distal Common Carotid [facility]) _9  Normal (no plaque) PSV (peak systolic velocity) _70_ cm/sec   _10  ?62% EDV (end diastolic velocity) _37_ cm/sec   _11  16-49% (*see CTA) Linear diameter _46_ mm   _12  50-69% Normal spectral waveform  _13  Yes   _14  ?70%  _15  No, specify pattern: __________________  Left Side Measurements   Both internal and distal common carotids are less than 30% _16 Yes       _17 No (cannot be used for implant)   Comments on determination of <30% stenosis:  Internal Carotid (ICA) _18  Stenosis Normal (no plaque) PSV (peak systolic velocity) _62_ cm/sec   _19  ?15%  EDV (end diastolic velocity) _83_ cm/sec   _20  16-49% (*see CTA) Linear diameter _46_ mm   _21  50-69%  Normal spectral waveform  _22  Yes   _23  ?70%   _24  No, specify pattern: __________________  Distal Common Carotid [facility])  _25  Stenosis Normal (no plaque) PSV (peak systolic velocity) _15_ cm/sec   _26  ?15%  EDV (end diastolic velocity) _17_ cm/sec   _27  16-49% (*see CTA) Linear diameter _57_ mm   _28  50-69%  Normal spectral waveform  _29  Yes   _30  ?70%   _31  No, specify pattern: __________________  Name of person reading CDU: Jamelle Haring, MD   Section C:  Computed Tomography Angiogram (CTA) (if required) _32  Done _33 Not Done  _34  CDU was inconclusive (e.g. *stenosis 16-49%) Date: _23_/_Jun_/_2022_  (DD / MMM / [place])  _35 Images sent to [facility]  _36  CDU was incomplete/not done    _37  CTA requested by [facility]    % atherosclerosis Internal Carotid (ICA) Distal Common Carotid [facility])  Right Side _10_% _10_%  Left Side _10_% _10_%  Name of person  reading CTA: Macy Mis MD   Section D:  Physician Assessment  Are there any vascular structures or orientations or neck anomalies that would be obstructive to the implantation path? _38 No _39 Yes (Specify side):     _40  Left   _41  Right  _42  Both  Has the subject had prior surgery, radiation, or endovascular stent placement in the carotid artery or the carotid sinus region? _43  No _44  Yes (Specify side):     _45  Left   _46  Right   _47  Both  Is at least one carotid bifurcation below the level of the mandible? _48  No _49  Yes (Specify side):     _50  Left   _51  Right   _52  Both  Are there any ulcerative carotid arterial plaques? _53  No _54  Yes (Specify side):     _55  Left   _56  Right   _57  Both  Is this subject a candidate for the BATwire procedure? _58  No _59  Yes (Specify side):     _60  Left   _61  Right   _62  Both  Section E:  Comments  Per Dr Trula Slade less than 10% on CTA of neck         Section F:  Signature   Person  completing form (Print Name): [name] :) _  Signature: [name] :) ____ Date: _06/24/2022_________________________    Six Minute Hall Walk Test Date:    _21_/_JUN_/_2022_  (DD / MMM / [place])  Distance Walked _396_ meters  Were there any devices used to assist the subject in walking (e.g. cane, walker, etc.)? _0  No   _1  Yes specify:_______________________  Was the walk terminated before 6 minutes? _2  No   _3  Yes specify reason: (select all that apply)    _4  Angina    _5   Dyspnea    _6   Fatigue    _7   Dizziness    _8   Syncope    _9   Other, specify:  Name of person conducting 6-Minute [place] Walk: [name] :) ___________   Page 1 of 1     Confidential   Form Protocol [phone] Rev. B dated 06-Apr-2019   Effective Date: 19-May-2019 Subject ID: _[id number]_ - _021_ - 015 Subject Initials: _V_ _A_ _H_ Visit Interval:  _10   Baseline     _11  6 Month  Section B:  NYHA   NYHA Classification Date:    _21_/_JUN_/_2022_  (DD / MMM / [place])  Select One Class  Subject Symptoms  _12  I No limitations of physical activity, No undue fatigue, palpitation or dyspnea  _13  II Slight limitation of physical activity, Comfortable at rest, Less than ordinary activity results in fatigue, Palpitation, or dyspnea  _14  III Marked limitation of physical activity, Comfortable at rest, Less than ordinary activity results in fatigue, palpitation, or dyspnea  _15  IV Unable to carry out any physical activity without discomfort, Symptoms of cardiac insufficiency at rest, Physical activity causes increased discomfort  Name of person conducting NYHA: [name] :) ____________________   Patient ID: _[id number]_ - _021_ - 015 Patient Initials: _V_ _A_ _H_ Visit Interval:  _16   Baseline     _17  6 Month  Section D:  MLWHF   [place] Living With Heart Failure Questionnaire Date:    _21_/_Jun_/_2022_  (DD / MMM / YYYY)  These questions concern how your heart failure (heart condition) has prevented you from living as you wanted during the last month.  These items listed below describe different ways some people are affected.  If you are sure an item does not apply to you or is not related to your heart failure then circle 0 (no) and go on to the next item.  If an item does apply to you, then circle the number rating how much it prevented you from living as you wanted.  Did your heart failure prevent you from living as you wanted during the last month by:   No Very little    Very much  1. Causing swelling in your ankles, legs, etc.? 0_18  1_19  2_20  3_21  4_22  5_23   2. Making you sit or lie down to rest during the day? 0_24  1_25  2_26  3_27  4_28  5_29   3. Making your walking about or climbing stairs difficult? 0_30  1_31  2_32  3_33  4_34  5_35   4. Making your working around the house or yard difficult? 0_36  1_37  2_38  3_39  4_40  5_41   5. Making your going places away from home difficult? 0_42  1_43  2_44  3_45  4_46  5_47   6. Making your sleeping well at night difficult? 0_48  1_49  2_50  3_51  4_52  5_53   7. Making your relating  to or doing things with your friends or family difficult? 0_54  1_55  2_56   3_57  4_58  5_59   8. Making your working to earn a living difficult? 0_60  1_61  2_62  3_63  4_64  5_65   9. Making your recreational pastimes, sports or hobbies difficult? 0_0  1_1  2_2  3_3  4_4  5_5   10. Making your sexual activities difficult? 0_6  1_7  2_8  3_9  4_10  5_11   11. Making you eat less of the foods you like? 0_12  1_13  2_14  3_15  4_16  5_17   12. Making you short of breath? 0_18  1_19  2_20  3_21  4_22  5_23   13. Making you tired, fatigued, or low on energy? 0_24  1_25  2_26  3_27  4_28  5_29   14. Making you stay in a hospital? 0_30  1_31  2_32  3_33  4_34  5_35   15. Costing you money for medical care? 0_36  1_37  2_38  3_39  4_40  5_41   16. Giving you side effects from medications? 0_42  1_43  2_44  3_45  4_46  5_47   17. Making you feel you are a burden to your family or friends? 0_48  1_49  2_50  3_51  4_52  5_53   18. Making you feel a loss of self-control in your life? 0_54  1_55  2_56  3_57  4_58  5_59   19. Making you worry? 0_60  1_61  2_62  3_63  4_64  5_65   20. Making it difficult for you to concentrate or remember things? 0_66  1_67  2_68  3_69  4_70  5_71   21 Making you feel depressed? 0_72  1_73  2_74  3_75  4_76  5_77   Section E:  Signature Section  Person Administering Questionnaire Name: (person who read first question and handed questionnaire to subject) [name] :) _____ (Print) 12/18/2020   [name] :) _____ (Sign) (Date)  Person Completing Questionnaire Name: (e.g. subject, person reading questionnaire, etc.) [name] :) _____ (Print) 12/18/2020   [name] :) _________ (Sign) (Date)

## 2020-12-18 NOTE — Progress Notes (Signed)
Carotid duplex has been completed.  Results can be found under chart review under CV PROC. 12/18/2020 12:13 PM Hamdan Toscano RVT, RDMS

## 2020-12-18 NOTE — Progress Notes (Signed)
  Echocardiogram 2D Echocardiogram with 3D and strain has been performed.  Leta Jungling M 12/18/2020, 11:51 AM

## 2020-12-19 ENCOUNTER — Other Ambulatory Visit: Payer: Self-pay | Admitting: *Deleted

## 2020-12-19 DIAGNOSIS — Z006 Encounter for examination for normal comparison and control in clinical research program: Secondary | ICD-10-CM

## 2020-12-19 LAB — BASIC METABOLIC PANEL
BUN/Creatinine Ratio: 9 (ref 9–20)
BUN: 14 mg/dL (ref 6–24)
CO2: 22 mmol/L (ref 20–29)
Calcium: 9.7 mg/dL (ref 8.7–10.2)
Chloride: 103 mmol/L (ref 96–106)
Creatinine, Ser: 1.53 mg/dL — ABNORMAL HIGH (ref 0.76–1.27)
Glucose: 121 mg/dL — ABNORMAL HIGH (ref 65–99)
Potassium: 4 mmol/L (ref 3.5–5.2)
Sodium: 143 mmol/L (ref 134–144)
eGFR: 54 mL/min/{1.73_m2} — ABNORMAL LOW (ref >59)

## 2020-12-19 LAB — PRO B NATRIURETIC PEPTIDE: NT-Pro BNP: 914 pg/mL — ABNORMAL HIGH (ref 0–121)

## 2020-12-19 NOTE — Progress Notes (Signed)
CTA order.

## 2020-12-19 NOTE — Progress Notes (Signed)
Batwire 021 screening

## 2020-12-20 ENCOUNTER — Ambulatory Visit (INDEPENDENT_AMBULATORY_CARE_PROVIDER_SITE_OTHER): Payer: Medicare Other | Admitting: Otolaryngology

## 2020-12-20 ENCOUNTER — Ambulatory Visit (HOSPITAL_COMMUNITY)
Admission: RE | Admit: 2020-12-20 | Discharge: 2020-12-20 | Disposition: A | Discharge status: Home / Self Care | Payer: Medicare Other | Source: Ambulatory Visit | Attending: Surgery | Admitting: Surgery

## 2020-12-20 ENCOUNTER — Ambulatory Visit (HOSPITAL_COMMUNITY): Payer: Medicare Other

## 2020-12-20 ENCOUNTER — Other Ambulatory Visit: Payer: Self-pay

## 2020-12-20 DIAGNOSIS — Z006 Encounter for examination for normal comparison and control in clinical research program: Secondary | ICD-10-CM

## 2020-12-20 LAB — ECHOCARDIOGRAM COMPLETE
Area-P 1/2: 5.54 cm2
Calc EF: 42.7 %
S' Lateral: 4.4 cm
Single Plane A2C EF: 46.1 %
Single Plane A4C EF: 42.1 %

## 2020-12-20 MED ORDER — SODIUM CHLORIDE (PF) 0.9 % IJ SOLN
INTRAMUSCULAR | Status: AC
  Filled 2020-12-20: qty 50

## 2020-12-20 MED ORDER — IOHEXOL 350 MG/ML SOLN
75.0000 mL | Freq: Once | INTRAVENOUS | Status: AC | PRN
  Administered 2020-12-20: 75 mL via INTRAVENOUS

## 2020-12-20 NOTE — Progress Notes (Signed)
Screening for Batwire 021 will inform patient

## 2020-12-20 NOTE — Progress Notes (Addendum)
Section A:  Administrative Section  Subject ID: _[id number]_ - _021____ - 015 Subject Initials: ___ VAH___ ___ Visit Interval:  (* = as required) [x]   Baseline     []  Implant/Pre D/C  Date of Report:   __23___/___JUN____/___2022____       (DD / MMM / )  []   1 Month     []  3 Month*  Name of ENT __Christopher MD_  []   6 Month*     []  12 Month*     []  Unscheduled*  Directions:  Baseline: complete sections B-E only   All other visits, complete all sections below (B-G)   Use comments box to document any abnormalities or changes from Baseline  Was the CVRx device turned off? []  Yes   []  No, specify reason why not: __________________________________  Was a laryngoscope used? [x]  Yes   []  No  Section B:  Dysphonia   Global Rating of Dysphonia: (Absence of recent upper respiratory infection or recent extensive voice abuse)   [x]   Normal (or clear)   []   Mild   []   Mild to Moderate   []   Moderate   []   Moderate to Severe   []   Severe   []   Aphonic  Section C:  Tongue/Pharynx   Assessment Observation Severity (complete only if abnormal); defined as:    Mild Significant impairment of functioning; patient is unable to carry out usual activities.    Moderate Patient experiences sufficient discomfort to interfere with or reduce their usual level of activity.    Severe Clinically evident impairment of functioning; patient is unable to carry out usual activities.  Pharynx at Rest [x]   Symmetric []   Mild   []  Asymmetric []  Moderate     []  Severe  Pharynx with Phonation ("ah") [x]   Symmetric []   Mild   []  Asymmetric []  Moderate     []  Severe  Tongue Atrophy [x]   Not Present []   Mild   []  Present []  Moderate     []  Severe  Tongue Mobility [x]   Normal []   Mild   []  Reduced []  Moderate     []  Severe  Tongue Protrusion [x]   Midline []   Mild Side: ____________   []  Deviation []  Moderate      []  Severe   Sensation of the Pharynx [x]   Normal []   Mild   []  Decreased []  Moderate     []   Severe  Section D:  House-Brackman Facial Paralysis Scale (Circle One)  Grade Impairment  I  [x]  Normal  II  []  Mild Dysfunction (slight weakness, normal symmetry at rest)  III  []  Moderate Dysfunction (obvious but not disfiguring weakness with synkinesis, normal symmetry at rest); Complete eye closure with maximal effort; Good forehead movement  IV  []  Moderately severe dysfunction (obvious and disfiguring asymmetry, significant synkinesis); Incomplete eye closure; moderate forehead movement  V  []  Severe dysfunction (barely perceptible motion  VI  []  Total paralysis (no movement)  Section E:  Baseline Cranial Nerve Damage  Does the subject have a presence of baseline cranial nerve dysfunction []  Yes, specify what dysfunction: _____________________________________   [x]  No  Section F:  ENT Notes (Pre-D/C and Follow-Up)  Were changes in Dysphonia from baseline or any abnormalities related to the [facility] Implant? []   Yes   []  No   []  Unknown   []  Not Applicable  Were changes in Tongue/Pharynx from baseline or any abnormalities related to the [facility] Implant? []   Yes   []   No   []  Unknown   []  Not Applicable  Were changes in House-Brackman Facial Paralysis Scale from baseline or any abnormalities related to the [facility] Implant? []   Yes   []  No   []  Unknown   []  Not Applicable  Were any of the above changes from baseline potentially related to the intubation for the procedure? []   Yes   []  No   []  Unknown   []  Not Applicable  Section G:  Cranial Nerves  Has there been new cranial nerve dysfunction since Baseline?   Marginal mandibular branch of cranial nerve VII (Facial Nerve) []  Yes, specify what changed: ___________________________________   []  No  Cranial nerve IX (Glossopharyngeal Nerve) []  Yes, specify what changed: ___________________________________   []  No  Cranial nerve X  (Vagus Nerve) []  Yes, specify what changed: ___________________________________   []  No  Cranial nerve  XI  (Accessory Nerve) []  Yes, specify what changed: ___________________________________   []  No  Cranial nerve XII  (Hypoglossal Nerve) []  Yes, specify what changed: ___________________________________   []  No  Section H:  Comments/Notes/Describe any abnormalities/deficiencies (as well if post-operative change)            

## 2020-12-24 NOTE — Progress Notes (Signed)
Cadfem 021 screening

## 2020-12-24 NOTE — Progress Notes (Signed)
Batwire 021 screening

## 2020-12-27 NOTE — Progress Notes (Signed)
Surgical Instructions    Your procedure is scheduled on July 28.  Report to [facility] Main Entrance "A" at 0530 A.M., then check in with the Admitting office.  Call this number if you have problems the morning of surgery:  [phone]   If you have any questions prior to your surgery date call [phone]: Open Monday-Friday 8am-4pm    Remember:  Medication Instructions: Stop all Herbal Medications 2-3 weeks prior to surgery   Do not eat after midnight the night before your surgery    There are No medications that you need to take the morning of surgery  As of today, STOP taking any Aspirin (unless otherwise instructed by your surgeon) Aleve, Naproxen, Ibuprofen, Motrin, Advil, Goody's, BC's, fish oil, and all vitamins.          Do not wear jewelry Do not wear lotions, powders, colognes, or deodorant. Men may shave face and neck. Do not bring valuables to the hospital. DO Not wear nail polish, gel polish, artificial nails, or any other type of covering on natural nails including finger and toenails. If patients have artificial nails, gel coating, etc. that need to be removed by a nail salon please have this removed prior to surgery or surgery may need to be canceled/delayed if the surgeon/ anesthesia feels like the patient is unable to be adequately monitored.             [facility] is not responsible for any belongings or valuables.  Do NOT Smoke (Tobacco/Vaping) or drink Alcohol 24 hours prior to your procedure If you use a CPAP at night, you may bring all equipment for your overnight stay.   Contacts, glasses, dentures or bridgework may not be worn into surgery, please bring cases for these belongings   For patients admitted to the hospital, discharge time will be determined by your treatment team.   Patients discharged the day of surgery will not be allowed to drive home, and someone needs to stay with them for 24 hours.  ONLY 1 SUPPORT PERSON MAY BE PRESENT WHILE YOU  ARE IN SURGERY. IF YOU ARE TO BE ADMITTED ONCE YOU ARE IN YOUR ROOM YOU WILL BE ALLOWED TWO (2) VISITORS.  Minor children may have two parents present. Special consideration for safety and communication needs will be reviewed on a case by case basis.  Special instructions:    Oral Hygiene is also important to reduce your risk of infection.  Remember - BRUSH YOUR TEETH THE MORNING OF SURGERY WITH YOUR REGULAR TOOTHPASTE   [facility]- Preparing For Surgery  Before surgery, you can play an important role. Because skin is not sterile, your skin needs to be as free of germs as possible. You can reduce the number of germs on your skin by washing with CHG (chlorahexidine gluconate) Soap before surgery.  CHG is an antiseptic cleaner which kills germs and bonds with the skin to continue killing germs even after washing.     Please do not use if you have an allergy to CHG or antibacterial soaps. If your skin becomes reddened/irritated stop using the CHG.  Do not shave (including legs and underarms) for at least 48 hours prior to first CHG shower. It is OK to shave your face.  Please follow these instructions carefully.     Shower the NIGHT BEFORE SURGERY and the MORNING OF SURGERY with CHG Soap.   If you chose to wash your hair, wash your hair first as usual with your normal shampoo. After  you shampoo, rinse your hair and body thoroughly to remove the shampoo.  Then [employer] and genitals (private parts) with your normal soap and rinse thoroughly to remove soap.  After that Use CHG Soap as you would any other liquid soap. You can apply CHG directly to the skin and wash gently with a scrungie or a clean washcloth.   Apply the CHG Soap to your body ONLY FROM THE NECK DOWN.  Do not use on open wounds or open sores. Avoid contact with your eyes, ears, mouth and genitals (private parts). Wash Face and genitals (private parts)  with your normal soap.   Wash thoroughly, paying special attention to the area  where your surgery will be performed.  Thoroughly rinse your body with warm water from the neck down.  DO NOT shower/wash with your normal soap after using and rinsing off the CHG Soap.  Pat yourself dry with a CLEAN TOWEL.  Wear CLEAN PAJAMAS to bed the night before surgery  Place CLEAN SHEETS on your bed the night before your surgery  DO NOT SLEEP WITH PETS.   Day of Surgery:  Take a shower with CHG soap. Wear Clean/Comfortable clothing the morning of surgery Do not apply any deodorants/lotions.   Remember to brush your teeth WITH YOUR REGULAR TOOTHPASTE.   Please read over the following fact sheets that you were given.

## 2021-01-21 ENCOUNTER — Other Ambulatory Visit: Payer: Self-pay

## 2021-01-21 ENCOUNTER — Encounter (HOSPITAL_COMMUNITY): Payer: Self-pay | Admitting: Internal Medicine

## 2021-01-21 NOTE — Anesthesia Preprocedure Evaluation (Addendum)
Anesthesia Evaluation  Patient identified by MRN, date of birth, ID band Patient awake    Reviewed: Allergy & Precautions, NPO status , Patient's Chart, lab work & pertinent test results  Airway Mallampati: II   Neck ROM: full    Dental   Pulmonary neg pulmonary ROS,    breath sounds clear to auscultation       Cardiovascular hypertension, +CHF  + Cardiac Defibrillator  Rhythm:regular Rate:Normal  EF 25%   Neuro/Psych PSYCHIATRIC DISORDERS Anxiety    GI/Hepatic   Endo/Other  diabetes, Type 2  Renal/GU Renal InsufficiencyRenal disease     Musculoskeletal   Abdominal   Peds  Hematology   Anesthesia Other Findings   Reproductive/Obstetrics                           Anesthesia Physical Anesthesia Plan  ASA: 3  Anesthesia Plan: General   Post-op Pain Management:    Induction: Intravenous  PONV Risk Score and Plan: 2 and Ondansetron, Dexamethasone, Treatment may vary due to age or medical condition and Midazolam  Airway Management Planned: Oral ETT  Additional Equipment:   Intra-op Plan:   Post-operative Plan: Extubation in OR  Informed Consent: I have reviewed the patients History and Physical, chart, labs and discussed the procedure including the risks, benefits and alternatives for the proposed anesthesia with the patient or authorized representative who has indicated his/her understanding and acceptance.     Dental advisory given  Plan Discussed with: CRNA, Anesthesiologist and Surgeon  Anesthesia Plan Comments: (PAT note by Antionette Poles, PA-C: Follows with cardiology at [facility] clinic for hx of HTN, NICM, HFrEF (EF 20-25%) s/p [place] Sci ICD. HFrEF has been refractory to GDMT and he has qualified for [employer] device.   Stage 3 CKD.   DM2, last A1c 7.3 on 11/12/20.  Perioperative cardiac device form has been sent to EP cardiologist.   Will need DOS labs and eval.   TTE  12/18/20: 1. Left ventricular ejection fraction, by estimation, is 20 to 25%. Left  ventricular ejection fraction by 3D volume is 24 %. The left ventricle has  severely decreased function. The left ventricle demonstrates global  hypokinesis. The left ventricular  internal cavity size was mildly dilated. Indeterminate diastolic filling  due to E-A fusion. The average left ventricular global longitudinal strain  is -9.8 %. The global longitudinal strain is abnormal.  2. Right ventricular systolic function is normal. The right ventricular  size is normal. There is normal pulmonary artery systolic pressure.  3. The mitral valve is normal in structure. Trivial mitral valve  regurgitation. No evidence of mitral stenosis.  4. The aortic valve is tricuspid. Aortic valve regurgitation is not  visualized. No aortic stenosis is present.  5. The inferior vena cava is normal in size with greater than 50%  respiratory variability, suggesting right atrial pressure of 3 mmHg.   Comparison(s): No prior Echocardiogram.   Conclusion(s)/Recommendation(s): Severelt reduced LVEF with global  hypokinesis.   Nuclear Stress 07/30/2017 (care everywhere): IMPRESSION:  SPECT imaging shows an abnormal study.  No ischemia.  There is moderate left ventricular cavity dilatation with severe global  hypokinesia consistent with cardiomyopathy.  EF is abnormal at 12%.  No transient ischemic dilatation( 1.11).  No extra cardiac tracer uptake.   )       Anesthesia Quick Evaluation

## 2021-01-21 NOTE — Progress Notes (Addendum)
SDW CALL  Patient was given pre-op instructions over the phone. The opportunity was given for the patient to ask questions. No further questions asked. Patient verbalized understanding of instructions given.   PCP -  Cardiologist - Tanda Rockers  PPM/ICD - ICD [place] Device Orders - faxed to Dr. Kathlene November [phone] Rep Notified - left message for Joey with [employer], contacted main number for [employer] - Waiting for return call from [place].  Will follow up tomorrow if no response today.    11:46 AM 01/23/21 Joey from [place] aware of procedure will be present tomorrow  Chest x-ray - not needed EKG - 08/15/20 Stress Test - denies ECHO - 12/20/20 Cardiac Cath - denies  Sleep Study - yes CPAP - negative  Fasting Blood Sugar - 120s Checks Blood Sugar ___1__ times a day  Aspirin: continue none DOS  COVID TEST- DOS   Anesthesia review: yes james aware  Patient denies shortness of breath, fever, cough and chest pain over the phone call   All instructions explained to the patient, with a verbal understanding of the material. Patient agrees to go over the instructions while at home for a better understanding. The opportunity to ask questions was provided.

## 2021-01-21 NOTE — Progress Notes (Signed)
Anesthesia Chart Review: Same day workup  Follows with cardiology at [facility] clinic for hx of HTN, NICM, HFrEF (EF 20-25%) s/p [place] Sci ICD. HFrEF has been refractory to GDMT and he has qualified for [employer] device.   Stage 3 CKD.   DM2, last A1c 7.3 on 11/12/20.  Perioperative cardiac device form has been sent to EP cardiologist.   Will need DOS labs and eval.   TTE 12/18/20:  1. Left ventricular ejection fraction, by estimation, is 20 to 25%. Left  ventricular ejection fraction by 3D volume is 24 %. The left ventricle has  severely decreased function. The left ventricle demonstrates global  hypokinesis. The left ventricular  internal cavity size was mildly dilated. Indeterminate diastolic filling  due to E-A fusion. The average left ventricular global longitudinal strain  is -9.8 %. The global longitudinal strain is abnormal.   2. Right ventricular systolic function is normal. The right ventricular  size is normal. There is normal pulmonary artery systolic pressure.   3. The mitral valve is normal in structure. Trivial mitral valve  regurgitation. No evidence of mitral stenosis.   4. The aortic valve is tricuspid. Aortic valve regurgitation is not  visualized. No aortic stenosis is present.   5. The inferior vena cava is normal in size with greater than 50%  respiratory variability, suggesting right atrial pressure of 3 mmHg.   Comparison(s): No prior Echocardiogram.   Conclusion(s)/Recommendation(s): Severelt reduced LVEF with global  hypokinesis.   Nuclear Stress 07/30/2017 (care everywhere): IMPRESSION:  SPECT imaging shows an abnormal study.  No ischemia.  There is moderate left ventricular cavity dilatation with severe global  hypokinesia consistent with cardiomyopathy.  EF is abnormal at 12%.  No transient ischemic dilatation( 1.11).  No extra cardiac tracer uptake.   Zannie Cove [facility] Short Stay Center/Anesthesiology Phone [phone] 01/21/2021 4:06  PM

## 2021-01-24 ENCOUNTER — Inpatient Hospital Stay (HOSPITAL_COMMUNITY): Payer: Medicare Other | Admitting: Physician Assistant

## 2021-01-24 ENCOUNTER — Inpatient Hospital Stay (HOSPITAL_COMMUNITY): Payer: Medicare Other

## 2021-01-24 ENCOUNTER — Encounter (HOSPITAL_COMMUNITY): Payer: Self-pay | Admitting: Internal Medicine

## 2021-01-24 ENCOUNTER — Encounter (HOSPITAL_COMMUNITY)
Admission: RE | Disposition: A | Discharge status: Home / Self Care | Payer: Self-pay | Source: Home / Self Care | Attending: Surgery

## 2021-01-24 ENCOUNTER — Observation Stay (HOSPITAL_COMMUNITY)
Admission: RE | Admit: 2021-01-24 | Discharge: 2021-01-25 | Disposition: A | Discharge status: Home / Self Care | Payer: Medicare Other | Attending: Surgery | Admitting: Surgery

## 2021-01-24 DIAGNOSIS — I130 Hypertensive heart and chronic kidney disease with heart failure and stage 1 through stage 4 chronic kidney disease, or unspecified chronic kidney disease: Secondary | ICD-10-CM | POA: Insufficient documentation

## 2021-01-24 DIAGNOSIS — Z95810 Presence of automatic (implantable) cardiac defibrillator: Secondary | ICD-10-CM | POA: Insufficient documentation

## 2021-01-24 DIAGNOSIS — I428 Other cardiomyopathies: Secondary | ICD-10-CM

## 2021-01-24 DIAGNOSIS — I509 Heart failure, unspecified: Secondary | ICD-10-CM

## 2021-01-24 DIAGNOSIS — Z79899 Other long term (current) drug therapy: Secondary | ICD-10-CM | POA: Insufficient documentation

## 2021-01-24 DIAGNOSIS — N183 Chronic kidney disease, stage 3 unspecified: Secondary | ICD-10-CM | POA: Insufficient documentation

## 2021-01-24 DIAGNOSIS — I5042 Chronic combined systolic (congestive) and diastolic (congestive) heart failure: Secondary | ICD-10-CM | POA: Insufficient documentation

## 2021-01-24 DIAGNOSIS — Z7982 Long term (current) use of aspirin: Secondary | ICD-10-CM | POA: Insufficient documentation

## 2021-01-24 DIAGNOSIS — Z20822 Contact with and (suspected) exposure to covid-19: Secondary | ICD-10-CM | POA: Diagnosis not present

## 2021-01-24 DIAGNOSIS — E1122 Type 2 diabetes mellitus with diabetic chronic kidney disease: Secondary | ICD-10-CM | POA: Diagnosis not present

## 2021-01-24 HISTORY — PX: BAROREFLEX SYSTEM INSERTION: EP1254

## 2021-01-24 LAB — COMPREHENSIVE METABOLIC PANEL
ALT: 13 U/L (ref 0–44)
AST: 20 U/L (ref 15–41)
Albumin: 3.8 g/dL (ref 3.5–5.0)
Alkaline Phosphatase: 77 U/L (ref 38–126)
Anion gap: 5 (ref 5–15)
BUN: 12 mg/dL (ref 6–20)
CO2: 28 mmol/L (ref 22–32)
Calcium: 9.5 mg/dL (ref 8.9–10.3)
Chloride: 104 mmol/L (ref 98–111)
Creatinine, Ser: 1.7 mg/dL — ABNORMAL HIGH (ref 0.61–1.24)
GFR, Estimated: 48 mL/min — ABNORMAL LOW (ref >60)
Glucose, Bld: 125 mg/dL — ABNORMAL HIGH (ref 70–99)
Potassium: 3.5 mmol/L (ref 3.5–5.1)
Sodium: 137 mmol/L (ref 135–145)
Total Bilirubin: 0.9 mg/dL (ref 0.3–1.2)
Total Protein: 6.6 g/dL (ref 6.5–8.1)

## 2021-01-24 LAB — TYPE AND SCREEN
ABO/RH(D): O POS
Antibody Screen: NEGATIVE

## 2021-01-24 LAB — GLUCOSE, CAPILLARY
Glucose-Capillary: 128 mg/dL — ABNORMAL HIGH (ref 70–99)
Glucose-Capillary: 152 mg/dL — ABNORMAL HIGH (ref 70–99)

## 2021-01-24 LAB — CBC
HCT: 44.3 % (ref 39.0–52.0)
Hemoglobin: 14.4 g/dL (ref 13.0–17.0)
MCH: 29.8 pg (ref 26.0–34.0)
MCHC: 32.5 g/dL (ref 30.0–36.0)
MCV: 91.7 fL (ref 80.0–100.0)
Platelets: 243 10*3/uL (ref 150–400)
RBC: 4.83 MIL/uL (ref 4.22–5.81)
RDW: 13.4 % (ref 11.5–15.5)
WBC: 4.5 10*3/uL (ref 4.0–10.5)
nRBC: 0 % (ref 0.0–0.2)

## 2021-01-24 LAB — SARS CORONAVIRUS 2 BY RT PCR (HOSPITAL ORDER, PERFORMED IN ~~LOC~~ HOSPITAL LAB): SARS Coronavirus 2: NEGATIVE

## 2021-01-24 LAB — ABO/RH: ABO/RH(D): O POS

## 2021-01-24 SURGERY — BAROREFLEX SYSTEM INSERTION
Anesthesia: General | Laterality: Right

## 2021-01-24 MED ORDER — FENTANYL CITRATE (PF) 250 MCG/5ML IJ SOLN
INTRAMUSCULAR | Status: AC
  Filled 2021-01-24: qty 5

## 2021-01-24 MED ORDER — LIDOCAINE 2% (20 MG/ML) 5 ML SYRINGE
INTRAMUSCULAR | Status: DC | PRN
  Administered 2021-01-24: 60 mg via INTRAVENOUS

## 2021-01-24 MED ORDER — LABETALOL HCL 5 MG/ML IV SOLN
10.0000 mg | INTRAVENOUS | Status: DC | PRN
  Administered 2021-01-24 (×3): 10 mg via INTRAVENOUS

## 2021-01-24 MED ORDER — CLEVIDIPINE BUTYRATE 0.5 MG/ML IV EMUL
1.0000 mg/h | INTRAVENOUS | Status: AC
  Administered 2021-01-24: 2 mg/h via INTRAVENOUS
  Filled 2021-01-24: qty 50

## 2021-01-24 MED ORDER — SODIUM CHLORIDE 0.9 % IV SOLN
INTRAVENOUS | Status: DC | PRN
  Administered 2021-01-24: 500 mL

## 2021-01-24 MED ORDER — CHLORHEXIDINE GLUCONATE CLOTH 2 % EX PADS: 6.0000 | MEDICATED_PAD | Freq: Once | CUTANEOUS | Status: DC

## 2021-01-24 MED ORDER — PANTOPRAZOLE SODIUM 40 MG PO TBEC
40.0000 mg | DELAYED_RELEASE_TABLET | Freq: Every day | ORAL | Status: DC
  Administered 2021-01-24 – 2021-01-25 (×2): 40 mg via ORAL
  Filled 2021-01-24 (×2): qty 1

## 2021-01-24 MED ORDER — SODIUM CHLORIDE 0.9 % IV SOLN: 250.0000 mL | INTRAVENOUS | Status: DC | PRN

## 2021-01-24 MED ORDER — CEFAZOLIN SODIUM-DEXTROSE 2-4 GM/100ML-% IV SOLN
INTRAVENOUS | Status: AC
  Filled 2021-01-24: qty 100

## 2021-01-24 MED ORDER — FENTANYL CITRATE (PF) 250 MCG/5ML IJ SOLN
INTRAMUSCULAR | Status: DC | PRN
  Administered 2021-01-24: 150 ug via INTRAVENOUS
  Administered 2021-01-24: 100 ug via INTRAVENOUS

## 2021-01-24 MED ORDER — HYDRALAZINE HCL 20 MG/ML IJ SOLN
5.0000 mg | INTRAMUSCULAR | Status: AC | PRN
  Administered 2021-01-24 (×2): 5 mg via INTRAVENOUS

## 2021-01-24 MED ORDER — SODIUM CHLORIDE 0.9 % IV SOLN: INTRAVENOUS | Status: DC

## 2021-01-24 MED ORDER — SODIUM CHLORIDE (PF) 0.9 % IJ SOLN
INTRAMUSCULAR | Status: AC
  Filled 2021-01-24: qty 10

## 2021-01-24 MED ORDER — MAGNESIUM SULFATE 2 GM/50ML IV SOLN: 2.0000 g | Freq: Every day | INTRAVENOUS | Status: DC | PRN

## 2021-01-24 MED ORDER — SACUBITRIL-VALSARTAN 49-51 MG PO TABS: 1.0000 | ORAL_TABLET | Freq: Two times a day (BID) | ORAL | Status: DC

## 2021-01-24 MED ORDER — CEFAZOLIN SODIUM-DEXTROSE 2-4 GM/100ML-% IV SOLN
2.0000 g | INTRAVENOUS | Status: AC
  Administered 2021-01-24: 2 g via INTRAVENOUS
  Filled 2021-01-24: qty 100

## 2021-01-24 MED ORDER — SODIUM CHLORIDE 0.9 % IV SOLN
0.0125 ug/kg/min | INTRAVENOUS | Status: AC
  Administered 2021-01-24: .3 ug/kg/min via INTRAVENOUS
  Administered 2021-01-24: .1 ug/kg/min via INTRAVENOUS
  Filled 2021-01-24: qty 2000

## 2021-01-24 MED ORDER — DOCUSATE SODIUM 100 MG PO CAPS
100.0000 mg | ORAL_CAPSULE | Freq: Every day | ORAL | Status: DC
  Filled 2021-01-24: qty 1

## 2021-01-24 MED ORDER — SODIUM CHLORIDE 0.9% FLUSH
3.0000 mL | Freq: Two times a day (BID) | INTRAVENOUS | Status: DC
  Administered 2021-01-24 – 2021-01-25 (×2): 3 mL via INTRAVENOUS

## 2021-01-24 MED ORDER — ASPIRIN EC 81 MG PO TBEC
81.0000 mg | DELAYED_RELEASE_TABLET | Freq: Every day | ORAL | Status: DC
  Administered 2021-01-25: 81 mg via ORAL
  Filled 2021-01-24: qty 1

## 2021-01-24 MED ORDER — CARVEDILOL 25 MG PO TABS
50.0000 mg | ORAL_TABLET | Freq: Two times a day (BID) | ORAL | Status: DC
  Administered 2021-01-24 – 2021-01-25 (×2): 50 mg via ORAL
  Filled 2021-01-24 (×2): qty 2

## 2021-01-24 MED ORDER — NOREPINEPHRINE 4 MG/250ML-% IV SOLN
INTRAVENOUS | Status: DC | PRN
  Administered 2021-01-24: 2 ug/min via INTRAVENOUS

## 2021-01-24 MED ORDER — VASOPRESSIN 20 UNITS/100 ML INFUSION FOR SHOCK
INTRAVENOUS | Status: DC | PRN
  Administered 2021-01-24: .04 [IU]/min via INTRAVENOUS

## 2021-01-24 MED ORDER — LACTATED RINGERS IV SOLN: INTRAVENOUS | Status: DC | PRN

## 2021-01-24 MED ORDER — PHENOL 1.4 % MT LIQD: 1.0000 | OROMUCOSAL | Status: DC | PRN

## 2021-01-24 MED ORDER — HEPARIN (PORCINE) IN NACL 1000-0.9 UT/500ML-% IV SOLN
INTRAVENOUS | Status: DC | PRN
  Administered 2021-01-24: 500 mL

## 2021-01-24 MED ORDER — LABETALOL HCL 5 MG/ML IV SOLN
INTRAVENOUS | Status: AC
  Filled 2021-01-24: qty 4

## 2021-01-24 MED ORDER — LABETALOL HCL 5 MG/ML IV SOLN: 10.0000 mg | INTRAVENOUS | Status: DC | PRN

## 2021-01-24 MED ORDER — HYDRALAZINE HCL 20 MG/ML IJ SOLN
INTRAMUSCULAR | Status: AC
  Filled 2021-01-24: qty 1

## 2021-01-24 MED ORDER — AMLODIPINE BESYLATE 5 MG PO TABS
5.0000 mg | ORAL_TABLET | Freq: Every morning | ORAL | Status: DC
  Administered 2021-01-24 – 2021-01-25 (×2): 5 mg via ORAL
  Filled 2021-01-24 (×2): qty 1

## 2021-01-24 MED ORDER — ONDANSETRON HCL 4 MG/2ML IJ SOLN
INTRAMUSCULAR | Status: DC | PRN
  Administered 2021-01-24: 4 mg via INTRAVENOUS

## 2021-01-24 MED ORDER — MIDAZOLAM HCL (PF) 10 MG/2ML IJ SOLN
INTRAMUSCULAR | Status: AC
  Filled 2021-01-24: qty 2

## 2021-01-24 MED ORDER — METOPROLOL TARTRATE 5 MG/5ML IV SOLN: 2.0000 mg | INTRAVENOUS | Status: DC | PRN

## 2021-01-24 MED ORDER — ASPIRIN 81 MG PO TBEC: 81.0000 mg | DELAYED_RELEASE_TABLET | Freq: Four times a day (QID) | ORAL | Status: DC | PRN

## 2021-01-24 MED ORDER — PROPOFOL 10 MG/ML IV BOLUS
INTRAVENOUS | Status: DC | PRN
  Administered 2021-01-24: 50 mg via INTRAVENOUS
  Administered 2021-01-24: 30 mg via INTRAVENOUS
  Administered 2021-01-24: 20 mg via INTRAVENOUS

## 2021-01-24 MED ORDER — ACETAMINOPHEN 325 MG PO TABS: 325.0000 mg | ORAL_TABLET | ORAL | Status: DC | PRN

## 2021-01-24 MED ORDER — POTASSIUM CHLORIDE CRYS ER 20 MEQ PO TBCR: 20.0000 meq | EXTENDED_RELEASE_TABLET | Freq: Every day | ORAL | Status: DC | PRN

## 2021-01-24 MED ORDER — LACTATED RINGERS IV SOLN: INTRAVENOUS | Status: DC

## 2021-01-24 MED ORDER — ONDANSETRON HCL 4 MG/2ML IJ SOLN: 4.0000 mg | Freq: Four times a day (QID) | INTRAMUSCULAR | Status: DC | PRN

## 2021-01-24 MED ORDER — MIDAZOLAM HCL 2 MG/2ML IJ SOLN
INTRAMUSCULAR | Status: DC | PRN
  Administered 2021-01-24 (×2): 1 mg via INTRAVENOUS
  Administered 2021-01-24 (×4): 2 mg via INTRAVENOUS

## 2021-01-24 MED ORDER — CEFAZOLIN SODIUM-DEXTROSE 2-4 GM/100ML-% IV SOLN
2.0000 g | Freq: Three times a day (TID) | INTRAVENOUS | Status: AC
  Administered 2021-01-24 (×2): 2 g via INTRAVENOUS
  Filled 2021-01-24 (×2): qty 100

## 2021-01-24 MED ORDER — AMISULPRIDE (ANTIEMETIC) 5 MG/2ML IV SOLN
10.0000 mg | Freq: Once | INTRAVENOUS | Status: AC
  Administered 2021-01-24: 10 mg via INTRAVENOUS
  Filled 2021-01-24 (×2): qty 4

## 2021-01-24 MED ORDER — SODIUM CHLORIDE 0.9 % IV SOLN
0.0125 ug/kg/min | INTRAVENOUS | Status: DC
  Filled 2021-01-24: qty 2000

## 2021-01-24 MED ORDER — ACETAMINOPHEN 650 MG RE SUPP: 325.0000 mg | RECTAL | Status: DC | PRN

## 2021-01-24 MED ORDER — GUAIFENESIN-DM 100-10 MG/5ML PO SYRP: 15.0000 mL | ORAL_SOLUTION | ORAL | Status: DC | PRN

## 2021-01-24 MED ORDER — SUGAMMADEX SODIUM 200 MG/2ML IV SOLN
INTRAVENOUS | Status: DC | PRN
  Administered 2021-01-24: 200 mg via INTRAVENOUS

## 2021-01-24 MED ORDER — ROCURONIUM BROMIDE 10 MG/ML (PF) SYRINGE
PREFILLED_SYRINGE | INTRAVENOUS | Status: DC | PRN
  Administered 2021-01-24 (×2): 10 mg via INTRAVENOUS
  Administered 2021-01-24: 70 mg via INTRAVENOUS
  Administered 2021-01-24: 20 mg via INTRAVENOUS

## 2021-01-24 MED ORDER — DEXAMETHASONE SODIUM PHOSPHATE 10 MG/ML IJ SOLN
INTRAMUSCULAR | Status: DC | PRN
  Administered 2021-01-24: 5 mg via INTRAVENOUS

## 2021-01-24 MED ORDER — CHLORHEXIDINE GLUCONATE 0.12 % MT SOLN
OROMUCOSAL | Status: AC
  Administered 2021-01-24: 15 mL
  Filled 2021-01-24: qty 15

## 2021-01-24 MED ORDER — ALUM & MAG HYDROXIDE-SIMETH 200-200-20 MG/5ML PO SUSP: 15.0000 mL | ORAL | Status: DC | PRN

## 2021-01-24 MED ORDER — SODIUM CHLORIDE 0.9% FLUSH: 3.0000 mL | INTRAVENOUS | Status: DC | PRN

## 2021-01-24 MED ORDER — SODIUM CHLORIDE 0.9 % IV SOLN: 500.0000 mL | Freq: Once | INTRAVENOUS | Status: DC | PRN

## 2021-01-24 MED ORDER — SACUBITRIL-VALSARTAN 97-103 MG PO TABS
1.0000 | ORAL_TABLET | Freq: Two times a day (BID) | ORAL | Status: DC
  Administered 2021-01-24 – 2021-01-25 (×2): 1 via ORAL
  Filled 2021-01-24 (×3): qty 1

## 2021-01-24 MED ORDER — OXYCODONE-ACETAMINOPHEN 5-325 MG PO TABS
1.0000 | ORAL_TABLET | ORAL | Status: DC | PRN
  Administered 2021-01-24: 2 via ORAL
  Filled 2021-01-24: qty 2

## 2021-01-24 MED ORDER — MORPHINE SULFATE (PF) 2 MG/ML IV SOLN: 2.0000 mg | INTRAVENOUS | Status: DC | PRN

## 2021-01-24 MED ORDER — SODIUM CHLORIDE 0.9 % IV SOLN
INTRAVENOUS | Status: AC
  Filled 2021-01-24: qty 2

## 2021-01-24 MED ORDER — ETOMIDATE 2 MG/ML IV SOLN
INTRAVENOUS | Status: DC | PRN
  Administered 2021-01-24: 4 mg via INTRAVENOUS
  Administered 2021-01-24: 15 mg via INTRAVENOUS
  Administered 2021-01-24: 4 mg via INTRAVENOUS
  Administered 2021-01-24: 5 mg via INTRAVENOUS

## 2021-01-24 SURGICAL SUPPLY — 5 items
GENERATOR IPG BAROSTIM 2102 (Generator) ×1 IMPLANT
LEAD CAROTID BAROSTIM 1036 (Lead) ×1 IMPLANT
PAD PRO RADIOLUCENT 2001M-C (PAD) ×1 IMPLANT
SHEATH PROBE COVER 6X72 (BAG) ×1 IMPLANT
TRAY PACEMAKER INSERTION (PACKS) ×1 IMPLANT

## 2021-01-24 NOTE — Transfer of Care (Signed)
Immediate Anesthesia Transfer of Care Note  Patient: Phillip Ruiz  Procedure(s) Performed: BAROREFLEX SYSTEM INSERTION (Right)  Patient Location: PACU  Anesthesia Type:General  Level of Consciousness: awake  Airway & Oxygen Therapy: Patient Spontanous Breathing and Patient connected to face mask oxygen  Post-op Assessment: Report given to RN and Post -op Vital signs reviewed and stable  Post vital signs: Reviewed and stable  Last Vitals:  Vitals Value Taken Time  BP 160/80 01/24/21 1155  Temp 36.4 C 01/24/21 1154  Pulse 80 01/24/21 1152  Resp 14 01/24/21 1200  SpO2 97 % 01/24/21 1152  Vitals shown include unvalidated device data.  Last Pain:  Vitals:   01/24/21 1154  TempSrc: Temporal  PainSc: Asleep      Patients Stated Pain Goal: 3 (01/21/21 1009)  Complications: No notable events documented.

## 2021-01-24 NOTE — Anesthesia Procedure Notes (Signed)
Arterial Line Insertion Start/End7/28/2022 7:00 AM, 01/24/2021 7:10 AM Performed by: Tressia Miners, CRNA, CRNA  Patient location: Pre-op. Preanesthetic checklist: patient identified, IV checked, site marked, risks and benefits discussed, surgical consent, monitors and equipment checked, pre-op evaluation, timeout performed and anesthesia consent Lidocaine 1% used for infiltration Right, radial was placed Catheter size: 20 G Hand hygiene performed  and maximum sterile barriers used   Attempts: 1 Procedure performed without using ultrasound guided technique. Following insertion, dressing applied and Biopatch. Post procedure assessment: normal and unchanged  Patient tolerated the procedure well with no immediate complications.

## 2021-01-24 NOTE — Progress Notes (Addendum)
Contacted main lab for status of covid test, 8 minutes left to run

## 2021-01-24 NOTE — Progress Notes (Signed)
Smile is symmetrical; tongue midline

## 2021-01-24 NOTE — H&P (Signed)
Phillip Ruiz is a 50 y.o. male who presents for for barostim implantation using Batwire technique.  Since last being seen in our clinic the patient reports doing overall well, and about the same. He remains SOB with moderate exertion, including SOB walking around a grocery store.  he denies chest pain, palpitations, PND, orthopnea, nausea, vomiting, dizziness, syncope, edema, weight gain, or early satiety.       Past Medical History:  Diagnosis Date   Acne      Nec   Anxiety     Cardiomyopathy (HCC)     Chronic combined systolic and diastolic congestive heart failure, NYHA class 3 (HCC)     CKD (chronic kidney disease), stage III (HCC)     DM type 2 (diabetes mellitus, type 2) (HCC)     HFrEF (heart failure with reduced ejection fraction) (HCC)     History of gout     Hypertension, essential, benign     ICD (implantable cardioverter-defibrillator) in place     Insomnia      Nec   LVH (left ventricular hypertrophy)     Poor compliance     SOB (shortness of breath) on exertion     Uncontrolled hypertension                Current Outpatient Medications  Medication Sig Dispense Refill   allopurinol (ZYLOPRIM) 100 MG tablet Take 200 mg by mouth daily.       amLODipine (NORVASC) 10 MG tablet Take 10 mg by mouth daily.       aspirin 81 MG EC tablet Take 81 mg by mouth daily.       calcitRIOL (ROCALTROL) 0.5 MCG capsule Take 0.25 mcg by mouth in the morning and at bedtime.       carvedilol (COREG) 25 MG tablet Take 25 mg by mouth in the morning and at bedtime.       ENTRESTO 49-51 MG Take 1 tablet by mouth 2 (two) times daily.       ergocalciferol (VITAMIN D2) 1.25 MG (50000 UT) capsule Vitamin D2 1,250 mcg (50,000 unit) capsule  TAKE 1 CAPSULE BY MOUTH WEEKLY       minoxidil (LONITEN) 2.5 MG tablet 205 mg in the morning and at bedtime.       Multiple Vitamin (MULTIVITAMIN PO) Take 1,000 Units by mouth daily.       torsemide (DEMADEX) 20 MG tablet Take 20 mg by mouth in the morning  and at bedtime.        No current facility-administered medications for this visit.      No Known Allergies   Social History         Socioeconomic History   Marital status: Divorced      Spouse name: Not on file   Number of children: Not on file   Years of education: Not on file   Highest education level: Not on file  Occupational History   Not on file  Tobacco Use   Smoking status: Never Smoker   Smokeless tobacco: Never Used  Vaping Use   Vaping Use: Never used  Substance and Sexual Activity   Alcohol use: Never   Drug use: Never   Sexual activity: Not on file  Other Topics Concern   Not on file  Social History Narrative   Not on file    Social Determinants of Health    Financial Resource Strain: Not on file  Food Insecurity: Not on file  Transportation Needs: Not  on file  Physical Activity: Not on file  Stress: Not on file  Social Connections: Not on file  Intimate Partner Violence: Not on file    Review of Systems: All other systems reviewed and are otherwise negative except as noted above.   Physical Exam: Vitals:   01/24/21 0545 01/24/21 0608  BP: (!) 187/114 (!) 167/110  Pulse: 73   Resp: 18   Temp: 97.8 F (36.6 C)   SpO2: 98%       GEN- The patient is well appearing, alert and oriented x 3 today.   HEENT: normocephalic, atraumatic; sclera clear, conjunctiva pink; hearing intact; oropharynx clear; neck supple, no JVP Lymph- no cervical lymphadenopathy Lungs- Clear to ausculation bilaterally, normal work of breathing.  No wheezes, rales, rhonchi Heart- Regular rate and rhythm, no murmurs, rubs or gallops, PMI not laterally displaced GI- soft, non-tender, non-distended, bowel sounds present, no hepatosplenomegaly Extremities- no clubbing, cyanosis, or edema; DP/PT/radial pulses 2+ bilaterally MS- no significant deformity or atrophy Skin- warm and dry, no rash or lesion Psych- euthymic mood, full affect Neuro- strength and sensation are intact    Ekgs in epic reviewed,  QRS <130 msec Echo reveals EF 20%   Additional studies reviewed include: Previous EP notes and CareEverywhere   Assessment and Plan:   1. Chronic systolic CHF Pts GDMT has been titrated and his EF remains <35%.  He has ongoing NYHA Class III CHF.   He is not a candidate for CRT.   I believe that he is a good candidate for BAT Therapy.  Risks, benefits, and alternatives to BAT therapy using Batwire technique (research protocol) were discussed in detail by me and Dr Myra Gianotti today.  The patient understands that risks include but are not limited to bleeding, infection,  neurovascular damage with possible facial paralysis, hoarsenss, renal failure, MI, stroke, and death, and wishes to proceed.    Hillis Range MD, [facility] 01/24/2021 7:26 AM

## 2021-01-24 NOTE — H&P (Signed)
   Patient name: Phillip Ruiz MRN: [id number] DOB: 03-20-1971 Sex: male    HISTORY OF PRESENT ILLNESS:   MICHAELA Ruiz is a 50 y.o. male with chronic systolic heart failure with EF of 20-25% who is being cosidered for Barostim Neo implant via BATWIRE technique.  He has stage III chronic renal insufficiency.  He has a ICD in place.  HE is medi ally managed for hypertension.  CURRENT MEDICATIONS:    Current Facility-Administered Medications  Medication Dose Route Frequency Provider Last Rate Last Admin   0.9 %  sodium chloride infusion   Intravenous Continuous Allred, James, MD       ceFAZolin (ANCEF) IVPB 2g/100 mL premix  2 g Intravenous 30 min Pre-Op Allred, Fayrene Fearing, MD       Chlorhexidine Gluconate Cloth 2 % PADS 6 each  6 each Topical Once Allred, Fayrene Fearing, MD       lactated ringers infusion   Intravenous Continuous Hodierne, Adam, MD        REVIEW OF SYSTEMS:   [X]  denotes positive finding, [ ]  denotes negative finding Cardiac  Comments:  Chest pain or chest pressure:    Shortness of breath upon exertion:    Short of breath when lying flat:    Irregular heart rhythm:    Constitutional    Fever or chills:      PHYSICAL EXAM:   Vitals:   01/21/21 1009 01/24/21 0545 01/24/21 0608  BP:  (!) 187/114 (!) 167/110  Pulse:  73   Resp:  18   Temp:  97.8 F (36.6 C)   TempSrc:  Oral   SpO2:  98%   Weight: 88.5 kg 88.5 kg   Height: 5\' 8"  (1.727 m) 5\' 8"  (1.727 m)     GENERAL: The patient is a well-nourished male, in no acute distress. The vital signs are documented above. CARDIOVASCULAR: There is a regular rate and rhythm. PULMONARY: Non-labored respirations U/s was used to evaluate the carotid bifurcation which was in a appropriate location for BAtwirE  STUDIES:   I have reviewed his neck CT and carotid duplex which show minimal stenosis and palque  MEDICAL ISSUES:   We discussed using the BAtwire technique for Barostim Neo device   implant.  I discussed the details of the procedure as well as the risks and bene=fits.  All questions were answered and he is willing to proceed.  01/26/21, MD, FACS [facility] [phone] Pager [phone]

## 2021-01-24 NOTE — Progress Notes (Signed)
Clearing throat, heaving, threw up small amt liquid light green/yellow emesis. Paged anesthesiologist for nausea medicine.

## 2021-01-24 NOTE — Progress Notes (Signed)
S/p BATWIRE Awake and alert Incisions without hematoma Tongue midline, neuro intact Safe for transfer to floor   [employer]

## 2021-01-24 NOTE — Anesthesia Procedure Notes (Signed)
Procedure Name: Intubation Date/Time: 01/24/2021 7:55 AM Performed by: Reece Agar, CRNA Pre-anesthesia Checklist: Patient identified, Emergency Drugs available, Suction available and Patient being monitored Patient Re-evaluated:Patient Re-evaluated prior to induction Oxygen Delivery Method: Circle System Utilized Preoxygenation: Pre-oxygenation with 100% oxygen Induction Type: IV induction Ventilation: Mask ventilation without difficulty and Oral airway inserted - appropriate to patient size Laryngoscope Size: Mac and 4 Grade View: Grade I Tube type: Oral Tube size: 7.5 mm Number of attempts: 1 Airway Equipment and Method: Stylet and Oral airway Placement Confirmation: ETT inserted through vocal cords under direct vision, positive ETCO2 and breath sounds checked- equal and bilateral Secured at: 23 cm Tube secured with: Tape Dental Injury: Teeth and Oropharynx as per pre-operative assessment

## 2021-01-24 NOTE — Research (Addendum)
Section A:  Administrative Section  Subject ID: _[id number]_ - _021____ - 015 Subject Initials: ___ _VA[facility]__ ___ Visit Interval:  (* = as required) []   Baseline     [x]  Implant/Pre D/C  Date of Report:    ___28__/__JUL_____/__2022_____       (DD / MMM / )  []   1 Month     []  3 Month*  Name of ENT __Christopher MD_  []   6 Month*     []  12 Month*     []  Unscheduled*  Directions:  Baseline: complete sections B-E only   All other visits, complete all sections below (B-G)   Use comments box to document any abnormalities or changes from Baseline  Was the CVRx device turned off? [x]  Yes   []  No, specify reason why not: __________________________________  Was a laryngoscope used? [x]  Yes   []  No  Section B:  Dysphonia   Global Rating of Dysphonia: (Absence of recent upper respiratory infection or recent extensive voice abuse)   [x]   Normal (or clear)   []   Mild   []   Mild to Moderate   []   Moderate   []   Moderate to Severe   []   Severe   []   Aphonic  Section C:  Tongue/Pharynx   Assessment Observation Severity (complete only if abnormal); defined as:    Mild Significant impairment of functioning; patient is unable to carry out usual activities.    Moderate Patient experiences sufficient discomfort to interfere with or reduce their usual level of activity.    Severe Clinically evident impairment of functioning; patient is unable to carry out usual activities.  Pharynx at Rest [x]   Symmetric []   Mild   []  Asymmetric []  Moderate     []  Severe  Pharynx with Phonation ("ah") [x]   Symmetric []   Mild   []  Asymmetric []  Moderate     []  Severe  Tongue Atrophy [x]   Not Present []   Mild   []  Present []  Moderate     []  Severe  Tongue Mobility [x]   Normal []   Mild   []  Reduced []  Moderate     []  Severe  Tongue Protrusion [x]   Midline []   Mild Side: ____________   []  Deviation []  Moderate      []  Severe   Sensation of the Pharynx [x]   Normal []   Mild   []  Decreased []  Moderate     []   Severe  Section D:  House-Brackman Facial Paralysis Scale (Circle One)  Grade Impairment  I  [x]  Normal  II  []  Mild Dysfunction (slight weakness, normal symmetry at rest)  III  []  Moderate Dysfunction (obvious but not disfiguring weakness with synkinesis, normal symmetry at rest); Complete eye closure with maximal effort; Good forehead movement  IV  []  Moderately severe dysfunction (obvious and disfiguring asymmetry, significant synkinesis); Incomplete eye closure; moderate forehead movement  V  []  Severe dysfunction (barely perceptible motion  VI  []  Total paralysis (no movement)  Section E:  Baseline Cranial Nerve Damage  Does the subject have a presence of baseline cranial nerve dysfunction []  Yes, specify what dysfunction: _____________________________________   [x]  No  Section F:  ENT Notes (Pre-D/C and Follow-Up)  Were changes in Dysphonia from baseline or any abnormalities related to the [facility] Implant? []   Yes   [x]  No   []  Unknown   []  Not Applicable  Were changes in Tongue/Pharynx from baseline or any abnormalities related to the [facility] Implant? []   Yes   [  x] No   []  Unknown   []  Not Applicable  Were changes in House-Brackman Facial Paralysis Scale from baseline or any abnormalities related to the [facility] Implant? []   Yes   [x]  No   []  Unknown   []  Not Applicable  Were any of the above changes from baseline potentially related to the intubation for the procedure? []   Yes   [x]  No   []  Unknown   []  Not Applicable  Section G:  Cranial Nerves  Has there been new cranial nerve dysfunction since Baseline?   Marginal mandibular branch of cranial nerve VII (Facial Nerve) []  Yes, specify what changed: ___________________________________   [x]  No  Cranial nerve IX (Glossopharyngeal Nerve) []  Yes, specify what changed: ___________________________________   [x]  No  Cranial nerve X  (Vagus Nerve) []  Yes, specify what changed: ___________________________________   [x]  No   Cranial nerve XI  (Accessory Nerve) []  Yes, specify what changed: ___________________________________   [x]  No  Cranial nerve XII  (Hypoglossal Nerve) []  Yes, specify what changed: ___________________________________   [x]  No  Section H:  Comments/Notes/Describe any abnormalities/deficiencies (as well if post-operative change)

## 2021-01-25 DIAGNOSIS — I428 Other cardiomyopathies: Secondary | ICD-10-CM | POA: Diagnosis not present

## 2021-01-25 LAB — CBC
HCT: 42.1 % (ref 39.0–52.0)
Hemoglobin: 13.8 g/dL (ref 13.0–17.0)
MCH: 29.6 pg (ref 26.0–34.0)
MCHC: 32.8 g/dL (ref 30.0–36.0)
MCV: 90.3 fL (ref 80.0–100.0)
Platelets: 229 10*3/uL (ref 150–400)
RBC: 4.66 MIL/uL (ref 4.22–5.81)
RDW: 13.7 % (ref 11.5–15.5)
WBC: 10.3 10*3/uL (ref 4.0–10.5)
nRBC: 0 % (ref 0.0–0.2)

## 2021-01-25 LAB — BASIC METABOLIC PANEL
Anion gap: 11 (ref 5–15)
BUN: 15 mg/dL (ref 6–20)
CO2: 25 mmol/L (ref 22–32)
Calcium: 9.6 mg/dL (ref 8.9–10.3)
Chloride: 99 mmol/L (ref 98–111)
Creatinine, Ser: 1.94 mg/dL — ABNORMAL HIGH (ref 0.61–1.24)
GFR, Estimated: 41 mL/min — ABNORMAL LOW (ref >60)
Glucose, Bld: 206 mg/dL — ABNORMAL HIGH (ref 70–99)
Potassium: 3.8 mmol/L (ref 3.5–5.1)
Sodium: 135 mmol/L (ref 135–145)

## 2021-01-25 MED ORDER — OXYCODONE-ACETAMINOPHEN 5-325 MG PO TABS: 1.0000 | ORAL_TABLET | Freq: Four times a day (QID) | ORAL | 0 refills | Status: DC | PRN

## 2021-01-25 NOTE — Anesthesia Postprocedure Evaluation (Signed)
Anesthesia Post Note  Patient: Phillip Ruiz  Procedure(s) Performed: BAROREFLEX SYSTEM INSERTION (Right)     Patient location during evaluation: PACU Anesthesia Type: General Level of consciousness: awake and alert Pain management: pain level controlled Vital Signs Assessment: post-procedure vital signs reviewed and stable Respiratory status: spontaneous breathing, nonlabored ventilation, respiratory function stable and patient connected to nasal cannula oxygen Cardiovascular status: blood pressure returned to baseline and stable Postop Assessment: no apparent nausea or vomiting Anesthetic complications: no   No notable events documented.  Last Vitals:  Vitals:   01/25/21 0528 01/25/21 0830  BP: 130/90 (!) 143/88  Pulse: 77 83  Resp: 16 18  Temp: 36.8 C 36.8 C  SpO2: 96% 98%    Last Pain:  Vitals:   01/25/21 0830  TempSrc: Oral  PainSc:                  Storm Dulski S

## 2021-01-25 NOTE — Progress Notes (Signed)
Pt. Discharged to home with family. IV's removed. CCMD notified. Pt left with all their personal belongings. AVS documentation reviewed and sent home with patient and all questions answered.

## 2021-01-25 NOTE — Care Management CC44 (Signed)
Condition Code 44 Documentation Completed  Patient Details  Name: DEKE TILGHMAN MRN: [id number] Date of Birth: 1970/11/29   Condition Code 44 given:  Yes Patient signature on Condition Code 44 notice:  Yes Documentation of 2 MD's agreement:  Yes Code 44 added to claim:  Yes    Darrold Span, RN 01/25/2021, 10:26 AM

## 2021-01-25 NOTE — Care Management Obs Status (Signed)
MEDICARE OBSERVATION STATUS NOTIFICATION   Patient Details  Name: ARTEMIO DOBIE MRN: [id number] Date of Birth: 05-12-1971   Medicare Observation Status Notification Given:  Yes    Darrold Span, RN 01/25/2021, 10:26 AM

## 2021-01-25 NOTE — Progress Notes (Signed)
  Progress Note    01/25/2021 7:42 AM 1 Day Post-Op  Subjective:  No complaints.  Denies stroke like symptoms overnight including slurring speech, changes in vision, or one sided weakness   Vitals:   01/25/21 0500 01/25/21 0528  BP: 130/90 130/90  Pulse: 77 77  Resp: 16 16  Temp: 98.2 F (36.8 C) 98.2 F (36.8 C)  SpO2: 98% 96%   Physical Exam: Lungs:  non labored Incisions:  R neck incision c/d/i Extremities:  moving all extremities well Abdomen:  soft Neurologic: CN grossly intact  CBC    Component Value Date/Time   WBC 10.3 01/25/2021 0101   RBC 4.66 01/25/2021 0101   HGB 13.8 01/25/2021 0101   HCT 42.1 01/25/2021 0101   PLT 229 01/25/2021 0101   MCV 90.3 01/25/2021 0101   MCH 29.6 01/25/2021 0101   MCHC 32.8 01/25/2021 0101   RDW 13.7 01/25/2021 0101    BMET    Component Value Date/Time   NA 135 01/25/2021 0101   NA 143 12/18/2020 1100   K 3.8 01/25/2021 0101   CL 99 01/25/2021 0101   CO2 25 01/25/2021 0101   GLUCOSE 206 (H) 01/25/2021 0101   BUN 15 01/25/2021 0101   BUN 14 12/18/2020 1100   CREATININE 1.94 (H) 01/25/2021 0101   CALCIUM 9.6 01/25/2021 0101   GFRNONAA 41 (L) 01/25/2021 0101   GFRAA 54 (L) 08/15/2020 1408    INR No results found for: INR   Intake/Output Summary (Last 24 hours) at 01/25/2021 0742 Last data filed at 01/25/2021 0400 Gross per 24 hour  Intake 1920.5 ml  Output 750 ml  Net 1170.5 ml     Assessment/Plan:  50 y.o. male is s/p R carotid batwire 1 Day Post-Op   Neuro exam unchanged overnight R neck incision unremarkable Follow up as scheduled with Dr. Jule Ser, PA-C [facility] [phone] 01/25/2021 7:42 AM

## 2021-01-27 NOTE — Addendum Note (Signed)
Encounter addended by: Novella Olive on: 01/27/2021 4:44 PM  Actions taken: Letter saved

## 2021-01-29 NOTE — Discharge Summary (Signed)
  Discharge Summary  Patient ID: Phillip Ruiz [id number] 50 y.o. [birth year]  Admit date: 01/24/2021  Discharge date and time: 01/25/2021 12:34 PM   Admitting Physician: Nada Libman, MD   Discharge Physician: same  Admission Diagnoses: CHF (congestive heart failure) (HCC) [I50.9]  Discharge Diagnoses: same  Admission Condition: fair  Discharged Condition: fair  Indication for Admission: BATWIRE implant  Hospital Course: Mr. Phillip Ruiz is a 50 year old male who came patient for right neck BATWIRE implants by Dr. Myra Gianotti and Dr. Johney Frame on 01/24/2021.  He tolerated the procedure well and was admitted to the hospital postoperatively.  POD #1 he was without strokelike symptoms and incision was clean, dry, and intact.  He was prescribed 1 to 2 days of narcotic pain medication for postoperative pain control.  He will follow-up with the research department in 2 weeks.  He was discharged in stable condition.  Consults: None  Treatments: surgery: Right neck BATWIRE implant by Dr. Myra Gianotti on 01/24/2021  Discharge Exam: See progress note 01/25/21 Vitals:   01/25/21 0830 01/25/21 0900  BP: (!) 143/88   Pulse: 83 78  Resp: 18 20  Temp: 98.3 F (36.8 C)   SpO2: 98% 98%     Disposition: Discharge disposition: 01-Home or Self Care       Patient Instructions:  Allergies as of 01/25/2021   No Known Allergies      Medication List     TAKE these medications    allopurinol 100 MG tablet Commonly known as: ZYLOPRIM Take 200 mg by mouth daily.   amLODipine 5 MG tablet Commonly known as: NORVASC Take 5 mg by mouth in the morning.   aspirin 81 MG EC tablet Take 81 mg by mouth daily as needed ("for heart").   calcitRIOL 0.5 MCG capsule Commonly known as: ROCALTROL Take 0.5 mcg by mouth every other day.   carvedilol 25 MG tablet Commonly known as: COREG Take 50 mg by mouth in the morning and at bedtime.   Entresto 97-103 MG Generic drug:  sacubitril-valsartan Take 1 tablet by mouth 2 (two) times daily. What changed: Another medication with the same name was removed. Continue taking this medication, and follow the directions you see here.   minoxidil 2.5 MG tablet Commonly known as: LONITEN Take 2.5 mg by mouth daily.   MULTIVITAMIN PO Take 1 tablet by mouth daily.   oxyCODONE-acetaminophen 5-325 MG tablet Commonly known as: PERCOCET/ROXICET Take 1 tablet by mouth every 6 (six) hours as needed for moderate pain.   Ozempic (0.25 or 0.5 MG/DOSE) 2 MG/1.5ML Sopn Generic drug: Semaglutide(0.25 or 0.5MG /DOS) Inject 1.25 mg into the skin every Sunday.   torsemide 20 MG tablet Commonly known as: DEMADEX Take 20 mg by mouth in the morning.   Vitamin D-3 25 MCG (1000 UT) Caps Take 1,000 Units by mouth in the morning and at bedtime.       Activity: activity as tolerated Diet: regular diet Wound Care: keep wound clean and dry  Follow-up with research department in 2 weeks.  Signed: Emilie Rutter, PA-C 01/29/2021 10:47 AM [facility] Office: [phone]

## 2021-01-31 NOTE — Op Note (Signed)
SURGEON:  Durene Cal MD   CO-SURGEON: Dr. Johney Frame     PREPROCEDURE DIAGNOSES:  1. Non-ischemic cardiomyopathy.       2. New York Heart Association class III, heart failure chronically.      POSTPROCEDURE DIAGNOSES:  1. Non-ischemic cardiomyopathy.     2. New York Heart Association class III, heart failure chronically.   PROCEDURES:    1. Baroreflexive activation therapy (Barostim) implant using Batwire technique  2. Carotid ultrasound  3. Defibrillator interrogation and reprogramming       INTRODUCTION:  Phillip Ruiz is a 50 y.o. male with medical history of non-ischemic cardiomyopathy, NYHA Class III CHF, and prior ICD implantation who presents today for baroreflexive activation therapy using Batwire technique.  The patient does not meet criteria for CRT due to narrow QRS.  Medical therapy has been optimized. The patient has been enrolled in the [facility] trial.    DESCRIPTION OF PROCEDURE:  Informed written consent was obtained and the patient was brought to the Electrophysiology lab in the fasting state. The patient received general anesthesia as sedation for the procedure today.  The patient's right neck and chest were prepped and draped in the usual sterile fashion by the cath lab staff.  A time out was called and antibiotics were administered.  Ultrasound was used to evaluate the carotid bifurcation which was marked on the skin surface with a ink pen.  The depth of bifurcation was 2 cm.   A longitudinal 9 mm incision was made 2 cm distal to the carotid bifurcation. Through this incision the 18-gauge needle was inserted.  This was tracked with ultrasound in both a longitudinal and transverse plane.  The tip of the needle was visualized on the anterior medial aspect of the proximal internal carotid artery which is position A.   A small looped wire was then advanced out of the needle under ultrasound visualization using the Batwire implant tool.   This was done under ultrasound guidance  and the needle tip was positioned at the anterior medial aspect of the internal carotid artery at its origin.  The needle tip was confirmed to be in good position.  The wire was advanced out the tip of the needle.  A 2cm incision was required in order to breing the wire out through the skin and a turning tool was placed to protect the tip.  A combination of sharp and blunt dissection was used to expose the wire.  The wire was also visualized under fluoroscopy to further guide the procedure.  Next, the needle was removed.  The CSL delivery tool was then used to dilate the space along the guide wire.  The CVRx Neo CSL Model 1036 (SN [id number]) electrode was positioned at the carotid bifurcation and mapping was performed in a standard fashion.    A pulse generator pocket was created along the right infraclavicular space in a standard fashion.  Hemostasis was assured with cautery.  A hemostat was used to create a tunnel between the neck incision and the pulse generator incision.  The CSL was then delivered into the IPG pocket. The lead was connected to the IPG for testing.   Impedance was checked and was appropriate.  Multiple electrode implantation sites were tested.  We had a slow but appropriate appropriate hemodynamic response at the site of electrode deployment.  The strain relief wing was sutured to the platysma muscle with a 6-0 prolene.  The lead was then connected to the CVRx Barostim Neo IPG device model  2102 (SN [id number]).  The wounds were then irrigated with antibiotic solution.  The excess lead was placed on the medial side of the pocket.  The pulse generator was then secured to the pectoral fascia with 2 2-0 silk sutures.  The fascia was closed with 3-0 vicryl and the skin was closed with 4-0 vicryl.  The 2 neck incisions were closed with a 3-0 vicryl on the platysma and the skin. Dermabond was applied.   EBL<60ml.  There were no early apparent complications.     The patients ICD was  interrogated and ICD therapies were turned on.  No interference was noted between the patients ICD and the Barostim Neo device with the Barostim Neo device at maximum output.           CONCLUSIONS:  1. Non-ischemic cardiomyopathy with chronic New York Heart Association class III heart failure, not a candidate for CRT  2. Successful implantation of a CVRX Barostim Neo device using Batwire technique for baroreflexive activation therapy  3. No early apparent complications.   Hillis Range MD, [facility] 01/24/2021 11:24 AM

## 2021-02-07 ENCOUNTER — Encounter: Payer: Medicare Other | Admitting: *Deleted

## 2021-02-07 VITALS — BP 137/96 | HR 75 | Resp 16 | Wt 200.0 lb

## 2021-02-07 DIAGNOSIS — Z006 Encounter for examination for normal comparison and control in clinical research program: Secondary | ICD-10-CM

## 2021-02-07 NOTE — Research (Signed)
Section A:  Administrative Section  Subject ID: __[id number]__ - __021_ - 015 Subject Initials: __V_ _A__ H___  Visit Interval:    []  Screening/Baseline    []  Activation   [x]  0.5 Month       []  1 Month     []  2 Month     []  3 Month       []  6 Month     []  12 Month         []  Unscheduled, reason for visit: _________________________________________  Section B:  Device Information   Battery Battery Voltage: 3.07 V   Battery Life: 100 Months   RRT Date: 04-Jun-2029 (DD/MMM/YYYY)  Lead Impeadance Right Lead ___807____ Ohms    []  Low    []  High   Left Lead _________ Ohms    []  Low    [x]  High  Programmed Settings Pathway Pulse Width Amplitude Frequency   []  Left 125 3 40   [x]  Right     Section C:  Programming Information (12 Month and Unscheduled - not required)  Date: _11_/_AUG_/_2022_     (DD / MMM / ) Device information, therapy schedule and programmed settings can be found on the session summary report  Site Clinician Present: __KAY MCCHESNEY____________________________  [employer] Employee helping with programming: __DEXTER O'STEEN___________________________________ []  N/A  Location of [facility] person: [x]  []  Onsite Remote  Subject Experience   1. Did the subject experience transient bradycardia or hypotension during device testing? []  [x]  Yes No   2. Did the subject experience transient electrical stimulation of non-vascular tissues? []  [x]  Yes No   3. If yes to either question above, was intervention beyond reprogramming needed or was it associated with an additional untoward event? []  []  Yes No   4a. Is there any indication that there has been unacceptable device interaction between the [employer] device and other implanted electrical stimulators or sensors device? []  [x]  []  Yes No N/A (no other device)   4b.                   Section D:  Arrythmia Interventions  Has the subject received a cardiac ablation since the last study visit? []  Yes  Date of last procedure:    ____/____/_____ (DD/MMM/YYYY)   [x]  No   []  Unknown   If yes, what type and # of ablations Type []  []  Atrial     []  []  Ventricular    # of procedures: ____________   Was this pre-planned prior to [employer] implant? []  Yes    []  No (complete/update AE form)  Has the subject received a cardioversion since the last study visit? []  Yes  Date of last procedure:   ____/____/_____ (DD/MMM/YYYY)   [x]  No   []  Unknown   If yes, what type and # of cardioversions? Type []  Medications     []  Electrical Shock    # of procedures: _____________   Was this pre-planned prior to [employer] implant? []  Yes    []   No (complete/update AE form)    Section E:  Adverse Events (N/A for Implant Interval)  Have any new adverse events or updates to existing events occurred since last visit? []   Yes (complete/update AE form)   [x]   No  Section F:  Medication Changes   Have there been any changes to the subject's home use medications for Arrhythmia, Antiplatelet/Anticoagulation and Heart failure medications since the last visit? []  Yes (update Med form)   [x]  No  Section G:  Comments     N/A                Section H:  Signature    Person completing form (Print Name): ____Marilyn Rudean Haskell, RN ____ Deirdre Pippins :)    Signature: ___Marilyn Rudean Haskell, RN____ Date: _11-AUG-2022_______ Deirdre Pippins :) 02/18/2021     Section A:  Administrative Section  Subject ID: _[id number]_ - _021_ - 015 Subject Initials: _V_ _A_ _H_  Visit Interval:    []  Screening/Baseline    []  Activation   [x]  0.5 Month       []  1 Month     []  2 Month     []  3 Month       []  6 Month     []  12 Month         []  Unscheduled, reason for visit: _________________________________________  Section B:  Physical Assessment   Date:    _11_/_AUG_/_2022_   (DD / MMM / )  Weight: _200_  []  kg    [x]  pounds Height (Screening Visit Only): ________  []  cm []  inches  Blood Pressure: _137_  / _96_mmHg  Heart Rate: _75_ bpm  Section E:  Signature    Person completing form (Print Name): :) ______________  Signature: :) ____ Date: _09/07/2022______________    SECTION A:  Administrative Section  Patient ID: - _[id number]_ - 015 Patient Initials: _V_ _A_ _H_  Visit Interval:    []  Screening/Baseline*    []  Activation   [x]  0.5 Month       []  1 Month     []  2 Month     []  3 Month       []  6 Month     []  12 Month         []  Unscheduled, reason for visit: _________________________________________  * For Screening / Baseline only the questions are based on 30 days prior to consent.  SECTION B:  COVID-19 Like Illness Symptoms   Has the subject experienced any cold, flu or COVID-19 symptoms since the last study visit? [x]  No  (skip to section C)     []  Yes, date of onset:  _____/_____ MMM/YYYY    If yes, check all symptoms that apply:   []  Fevers or chills   []  New or Worsening Cough  []  Productive  []  Dry    []  If yes to cough, indicate severity  []  Constant  []  Occasional, several per hour   []  New or worsened shortness of breath   []  Diarrhea   []  Altered or reduced sense of smell or taste   []  Muscle aches/Severe Fatigue   []  Chest pain or tightness   []  Sore throat   []  Nausea or vomiting  SECTION C:  COVID-19 Like Illness Testing   Has the patient been tested for COVID-19 since the last study visit? [x]  No     []  Yes, date of test:  _____/_____ MMM/YYYY    If yes, test results:   []  Positive []  Negative []  Unknown  Has the patient been tested for COVID-19 Antibodies since the last study visit? [x]  No     []  Yes, date of test:  _____/_____ MMM/YYYY    If yes, test results:   []  Positive []  Negative []  Unknown  Has the patient been vaccinated for COVID-19 since the last study visit? [x]  No     []  Yes, date of test:  _____/_____ MMM/YYYY  Has  the patient been tested for Influenza ("flu") since the last study visit? [x]  No     []  Yes,  date of test:  _____/_____ MMM/YYYY    If yes, test results:   []  Positive []  Negative []  Unknown  Has the patient been vaccinated for Influenza ("flu") since the last study visit? [x]  No     []  Yes, date of test:  _____/_____ MMM/YYYY  SECTION D:  COVID-19 Like Illness Exposure  Has the subject been told that they might have had COVID-19/have symptoms suggestive of COVID-19 since the last study visit? [x]  No   []  Yes   []  Unknown  Has the subject been exposed to anyone with known or suspected COVID-19 since the last study visit? [x]  No   []  Yes   []  Unknown  Has the subject been told that they might have had the "flu" or influenza since the last study visit? [x]  No   []  Yes   []  Unknown  SECTION E:  Effects of COVID Pandemic on Subject's Healthcare Interactions  Since the last study visit, did the subject feel the need to go to an emergency department or hospital for their heart failure but decided not to because of concerns about COVID-19? [x]  No   []  Yes   []  Unknown    If yes, how did they seek care (check all that apply):   []  Telemedicine visit []  In-person []  Clinic []  Urgent Care   []  Subject did not change hospital/ER use due to COVID-19 []  Other, specify: ________________________  Since the last study visit, did the subject have a cardiology/HF related appointment cancelled/rescheduled due to COVID-19 pandemic? [x]  No   []  Yes, how many? ___________   []  Unknown  Since the last study visit, did the subject have any cardiology/HF related telemedicine visit due to COVID-19 pandemic? [x]  No   []  Yes, how many? ___________   []  Unknown  Since the last study visit, did the subject have a cardiology/HF procedure cancelled/rescheduled due to COVID-19 pandemic? [x]  No   []  Yes, how many? ___________   []  Unknown  SECTION F:  Effects of COVID Pandemic on Subject's Medications  Since the last visit, did any of their heart failure medications change or stop, even for a short time?    If yes, enter change into medication eCRF [x]  No   []  Yes, how many? ___________   []  Unknown    If yes, why:   []  Instructed by Doctor []  Self-discontinued []  Unknown  Other: ________________  Since the last visit, was the subject prescribed any medications for COVID-19? [x]  No   []  Yes   []  Unknown    If yes, what medications (generic name):  SECTION G:  Effects of COVID Pandemic on Subject's Lifestyle  How has the subject's activity/exercise level changed due to COVID-19 pandemic? [x]  No change   []  More activity/exercise   []  Less activity/exercise  How has the subject's smoking habits changed due to COVID-19? [x]  Does not smoke   []  No change   []  Smoke more   []  Smoke less  How has the subject's alcohol drinking habits changed due to COVID-19? [x]  Does not drink   []  No change   []  Drink more   []  Drink less  Section H:  Signature   Person completing form (Print Name): __Kimberly :) _______________________   Signature: :) ________ Date: __09/07/2022________________________

## 2021-02-08 NOTE — Research (Addendum)
Late entry:   [place] Informed Consent   Subject Name: Phillip Ruiz  Subject met inclusion and exclusion criteria.  The informed consent form, study requirements and expectations were reviewed with the subject and questions and concerns were addressed prior to the signing of the consent form.  The subject verbalized understanding of the trial requirements.  The subject agreed to participate in the [place] trial and signed the informed consent on 12/18/2020.  The informed consent was obtained prior to performance of any protocol-specific procedures for the subject.  A copy of the signed informed consent was given to the subject and a copy was placed in the subject's medical record.   [name] D

## 2021-02-12 NOTE — Research (Signed)
[facility] Implant Procedure Section A:  Administrative Section  Subject ID: __[id number]___ - __021__ - 015 Subject Initials: _V__ _A__ _H__  Visit Interval:    []  Screening/Baseline    ? Activation   []  0.5 Month       []  1 Month     []  2 Month     []  3 Month       []  6 Month     []  12 Month         []  Unscheduled, reason for visit: _________________________________________  Section B:  Physical Assessment   Date:    __28___/__JUL_____/__2022_____   (DD / MMM / YYYY)  Weight: ____88.5__  ? kg    []  pounds Height (Screening Visit Only): ________  []  cm []  inches  Blood Pressure: __148__  / __85__mmHg Heart Rate: ___80_______ bpm  Section E:  Signature     Person completing form (Print Name): _______Jessica Jonnelle Lawniczak RN____________________   Signature: _____Jessica Undra Trembath, RN__________ Date: ______28-JUL-2022____________________             Section A:  Administrative Section  Subject ID: __[id number]__ - _021____ - 015 Subject Initials: _V__ __A_ __H_  Visit Interval:    []  Screening/Baseline    [x]  Activation   []  0.5 Month       []  1 Month     []  2 Month     []  3 Month       []  6 Month     []  12 Month         []  Unscheduled, reason for visit: _________________________________________  Section B:  Device Information   Battery Battery Voltage: 2.78 V   Battery Life: 134 Months   RRT Date: 24-Jan-2021 (DD/MMM/YYYY)  Lead Impeadance Right Lead ___576______ Ohms    []  Low    []  High   Left Lead _________ Ohms    []  Low    [x]  High  Programmed Settings Pathway Pulse Width Amplitude Frequency   []  Left 125 1 40   [x]  Right     Section C:  Programming Information (12 Month and Unscheduled - not required)  Date: __28__/_JUL______/__2022___     (DD / MMM / ) Device information, therapy schedule and programmed settings can be found on the session summary report  Site Clinician Present: __Jessica Xaivier Malay RN_____________________________________________________  helping  with programming: ______Stephanie Dolan__________________ []  N/A  Location of [facility] person: [x]  []  Onsite Remote  Subject Experience   1. Did the subject experience transient bradycardia or hypotension during device testing? []  [x]  Yes No   2. Did the subject experience transient electrical stimulation of non-vascular tissues? []  [x]  Yes No   3. If yes to either question above, was intervention beyond reprogramming needed or was it associated with an additional untoward event? []  []  Yes No   4a. Is there any indication that there has been unacceptable device interaction between the [employer] device and other implanted electrical stimulators or sensors device? []  [x]  []  Yes No N/A (no other device)   4b.                   Section D:  Arrythmia Interventions  Has the subject received a cardiac ablation since the last study visit? []  Yes  Date of last procedure:   ____/____/_____ (DD/MMM/YYYY)   [x]  No   []  Unknown   If yes, what type and # of ablations Type []  []  Atrial     []  []  Ventricular    #  of procedures: ____________   Was this pre-planned prior to [employer] implant? []  Yes    []  No (complete/update AE form)  Has the subject received a cardioversion since the last study visit? []  Yes  Date of last procedure:   ____/____/_____ (DD/MMM/YYYY)   [x]  No   []  Unknown   If yes, what type and # of cardioversions? Type []  Medications     []  Electrical Shock    # of procedures: _____________   Was this pre-planned prior to [employer] implant? []  Yes    []   No (complete/update AE form)    Section E:  Adverse Events (N/A for Implant Interval)  Have any new adverse events or updates to existing events occurred since last visit? []   Yes (complete/update AE form)   [x]   No  Section F:  Medication Changes   Have there been any changes to the subject's home use medications for Arrhythmia, Antiplatelet/Anticoagulation and Heart failure medications since the last visit? []  Yes (update Med  form)   [x]  No  Section G:  Comments                     Section H:  Signature    Person completing form (Print Name): _________Jessica Seraj Dunnam RN___________  Signature: ___Jessica Rainier Feuerborn RN_________________________________  Date: ___16-AUG-2022_______________________        SECTION A:  Administrative Section  Patient ID: _[id number]_ - __021___ - 015 Patient Initials: _V__ _A__ _H__  Visit Interval:    []  Screening/Baseline*    ? Activation   []  0.5 Month       []  1 Month     []  2 Month     []  3 Month       []  6 Month     []  12 Month         []  Unscheduled, reason for visit: _________________________________________  * For Screening / Baseline only the questions are based on 30 days prior to consent.  SECTION B:  COVID-19 Like Illness Symptoms   Has the subject experienced any cold, flu or COVID-19 symptoms since the last study visit? ? No  (skip to section C)     []  Yes, date of onset:  _____/_____ MMM/YYYY    If yes, check all symptoms that apply:   []  Fevers or chills   []  New or Worsening Cough  []  Productive  []  Dry    []  If yes to cough, indicate severity  []  Constant  []  Occasional, several per hour   []  New or worsened shortness of breath   []  Diarrhea   []  Altered or reduced sense of smell or taste   []  Muscle aches/Severe Fatigue   []  Chest pain or tightness   []  Sore throat   []  Nausea or vomiting  SECTION C:  COVID-19 Like Illness Testing   Has the patient been tested for COVID-19 since the last study visit? []  No     ? Yes, date of test:  28/_Jul__/_2022____ MMM/YYYY    If yes, test results:   []  Positive ? Negative []  Unknown  Has the patient been tested for COVID-19 Antibodies since the last study visit? ? No     []  Yes, date of test:  _____/_____ MMM/YYYY    If yes, test results:   []  Positive []  Negative []  Unknown  Has the patient been vaccinated for COVID-19 since the last study visit? ? No     []  Yes, date of test:  _____/_____ MMM/YYYY  Has  the patient been tested for Influenza ("flu") since the last study visit? ? No     []  Yes, date of test:  _____/_____ MMM/YYYY    If yes, test results:   []  Positive []  Negative []  Unknown  Has the patient been vaccinated for Influenza ("flu") since the last study visit? ? No     []  Yes, date of test:  _____/_____ MMM/YYYY  SECTION D:  COVID-19 Like Illness Exposure  Has the subject been told that they might have had COVID-19/have symptoms suggestive of COVID-19 since the last study visit? ? No   []  Yes   []  Unknown  Has the subject been exposed to anyone with known or suspected COVID-19 since the last study visit? ? No   []  Yes   []  Unknown  Has the subject been told that they might have had the "flu" or influenza since the last study visit? ? No   []  Yes   []  Unknown  SECTION E:  Effects of COVID Pandemic on Subject's Healthcare Interactions  Since the last study visit, did the subject feel the need to go to an emergency department or hospital for their heart failure but decided not to because of concerns about COVID-19? ? No   []  Yes   []  Unknown    If yes, how did they seek care (check all that apply):   []  Telemedicine visit []  In-person []  Clinic []  Urgent Care   []  Subject did not change hospital/ER use due to COVID-19 []  Other, specify: ________________________  Since the last study visit, did the subject have a cardiology/HF related appointment cancelled/rescheduled due to COVID-19 pandemic? []  No   []  Yes, how many? ___________   []  Unknown  Since the last study visit, did the subject have any cardiology/HF related telemedicine visit due to COVID-19 pandemic? ? No   []  Yes, how many? ___________   []  Unknown  Since the last study visit, did the subject have a cardiology/HF procedure cancelled/rescheduled due to COVID-19 pandemic? ? No   []  Yes, how many? ___________   []  Unknown  SECTION F:  Effects of COVID Pandemic on Subject's Medications  Since the last visit, did any  of their heart failure medications change or stop, even for a short time?   If yes, enter change into medication eCRF ? No   []  Yes, how many? ___________   []  Unknown    If yes, why:   []  Instructed by Doctor []  Self-discontinued []  Unknown  Other: ________________  Since the last visit, was the subject prescribed any medications for COVID-19? ? No   []  Yes   []  Unknown    If yes, what medications (generic name):  SECTION G:  Effects of COVID Pandemic on Subject's Lifestyle  How has the subject's activity/exercise level changed due to COVID-19 pandemic? ? No change   []  More activity/exercise   []  Less activity/exercise  How has the subject's smoking habits changed due to COVID-19? ? Does not smoke   [facility] 12-Feb-2021   []  Smoke more   []  Smoke less  How has the subject's alcohol drinking habits changed due to COVID-19? ? Does not drink   []  No change   []  Drink more   []  Drink less  Section H:  Signature    Person completing form (Print Name): ___Jessica Janayia Burggraf RN____________________________________    Signature: ________Jessica Ezana Hubbert RN_______________ Date: ____28-JUL-2022______________________       Section A:  Administrative Section  Subject ID:  _[id number]_ - __021___ - 015 Subject Initials:  __V_ __A_ __H_ Visit Interval: (optional) [x]   Implant     []  3 Month     []  6 Month     []  Unscheduled  Section B:  Fluoroscopy   Fluoroscopy Imaging Date:   _28____/_JUL______/___2022____  (DD  /  MMM  /   )  []  Images sent to [facility]  Was this imaging done due to suspected lead movement? []  Yes   [x]  No  If yes, please describe the movement of the lead:        List views that were obtained RAO 0 CRAN 0, RAO 41 CRAN 0, LAO 0 CRAN 0, RAO 38 CAUD 0, LAO 2 CAUD 0, LAO  What was the subject position (e.g. neck straight or in surgical position) Surgical position  Was a full sweep done (e.g. image from lateral to axial or 7 o'clock to noon) []  Yes   [x]  No  Name of person  reading fluoroscopy: MD  Section C: X-Ray  X-Ray Imaging - NO Date:  _____/_______/_______  (DD  /  MMM  /   )  []  Images sent to [facility]  Was this imaging done due to suspected lead movement? []  Yes   []  No  If yes, please describe the movement of the lead:        List views that were obtained   Name of person reviewing x-ray:   Comments:          Section D:  Signature   Person completing form (Print Name): ______Jessica Kasiah Manka RN_________________  Signature: _________Jessica Magdaleno Lortie RN_____________________  Date: __16-AUG-2022________________________

## 2021-02-19 ENCOUNTER — Encounter: Payer: Medicare Other | Admitting: *Deleted

## 2021-02-19 ENCOUNTER — Other Ambulatory Visit: Payer: Self-pay

## 2021-02-19 ENCOUNTER — Ambulatory Visit (INDEPENDENT_AMBULATORY_CARE_PROVIDER_SITE_OTHER): Payer: Medicare Other | Admitting: Otolaryngology

## 2021-02-19 VITALS — BP 145/101 | HR 77 | Resp 18 | Wt 204.0 lb

## 2021-02-19 DIAGNOSIS — Z006 Encounter for examination for normal comparison and control in clinical research program: Secondary | ICD-10-CM

## 2021-02-19 NOTE — Research (Signed)
[facility] 1 Month Visit  Patient came in today for his 1 Month post implant visit.  Patient stated that he has been feeling well and that he has went back to work since implant.  Patient stated that he still has some soreness at implant site but nothing major.  Patient stated that he has not experienced any shortness of breath.  Patient also denies swelling in his legs. Blood Pressure was elevated on today's visit.  Patient stated that he had not taken his Coreg today.  Educated the patient on the importance of taking his medications as prescribed.  Dr. Myra Gianotti came to see patient in the clinic, aware of blood pressure, stated that incision looks great and is healing nicely.  Patient stated that his scale at home isnt working, new scale given to patient.  Educated patient on weighing daily   Section A:  Administrative Section  Subject ID: ___[id number]______ - __021___ - 015 Subject Initials: __V_ _A__ _H__  Visit Interval:    []  Screening/Baseline    []  Activation   []  0.5 Month       [x]  1 Month     []  2 Month     []  3 Month       []  6 Month     []  12 Month         []  Unscheduled, reason for visit: _________________________________________  Section B:  Physical Assessment   Date:    __23___/__AUG_____/___2022____   (DD / MMM / YYYY)  Weight: _____204_______  []  kg    [x]  pounds Height (Screening Visit Only): ________  []  cm []  inches  Blood Pressure: __145____  / __101____mmHg Heart Rate: ____77______ bpm  Section E:  Signature     Person completing form (Print Name): ______Jessica Loveta Dellis RN_______________   Signature: ____Jessica Kayona Foor RN____________ Date: __23-AUG-2022    Section A:  Administrative Section  Subject ID: __[id number]__ - __021___ - 015 Subject Initials: __V_ _A__ _H__  Visit Interval:    []  Screening/Baseline    []  Activation   []  0.5 Month       [x]  1 Month     []  2 Month     []  3 Month       []  6 Month     []  12 Month         []  Unscheduled, reason for visit:  _________________________________________  Section B:  Device Information   Battery Battery Voltage: 3.03 V   Battery Life: 56.5 Months   RRT Date: 23/AUG/2022 (DD/MMM/YYYY)  Lead [employer]ance Right Lead ___807______ Ohms    []  Low    []  High   Left Lead _________ Ohms    []  Low    [x]  High  Programmed Settings Pathway Pulse Width Amplitude Frequency   []  Left 125 6.2 40   [x]  Right     Section C:  Programming Information (12 Month and Unscheduled - not required)  Date: __23__/__AUG_____/__2022___     (DD / MMM / ) Device information, therapy schedule and programmed settings can be found on the session summary report  Site Clinician Present: _____Jessica Quantavius Humm RN, [place]__________________  helping with programming: __Stephanie Dolan________ []  N/A  Location of CVRx person: [x]  []  Onsite Remote  Subject Experience   1. Did the subject experience transient bradycardia or hypotension during device testing? []  [x]  Yes No   2. Did the subject experience transient electrical stimulation of non-vascular tissues? []  [x]  Yes No   3. If yes to either question  above, was intervention beyond reprogramming needed or was it associated with an additional untoward event? []  []  Yes No   4a. Is there any indication that there has been unacceptable device interaction between the CVRx device and other implanted electrical stimulators or sensors device? []  [x]  []  Yes No N/A (no other device)   4b.                   Section D:  Arrythmia Interventions  Has the subject received a cardiac ablation since the last study visit? []  Yes  Date of last procedure:   ____/____/_____ (DD/MMM/YYYY)   [x]  No   []  Unknown   If yes, what type and # of ablations Type []  []  Atrial     []  []  Ventricular    # of procedures: ____________   Was this pre-planned prior to CVRx implant? []  Yes    []  No (complete/update AE form)  Has the subject received a cardioversion since  the last study visit? []  Yes  Date of last procedure:   ____/____/_____ (DD/MMM/YYYY)   [x]  No   []  Unknown   If yes, what type and # of cardioversions? Type []  Medications     []  Electrical Shock    # of procedures: _____________   Was this pre-planned prior to CVRx implant? []  Yes    []   No (complete/update AE form)    Section E:  Adverse Events (N/A for Implant Interval)  Have any new adverse events or updates to existing events occurred since last visit? [x]   Yes (complete/update AE form)   []   No  Section F:  Medication Changes   Have there been any changes to the subject's home use medications for Arrhythmia, Antiplatelet/Anticoagulation and Heart failure medications since the last visit? []  Yes (update Med form)   [x]  No  Section G:  Comments     N/A                Section H:  Signature    Person completing form (Print Name): _____Jessica Kristyna Bradstreet RN__________________    Signature: _____Jessica Dalin Caldera RN_________ Date: __23/AUG/2022________________________      SECTION A:  Administrative Section  Patient ID: - __021___ - 015 Patient Initials: _V__ __A_ _H__  Visit Interval:    []  Screening/Baseline*    []  Activation   []  0.5 Month       [x]  1 Month     []  2 Month     []  3 Month       []  6 Month     []  12 Month         []  Unscheduled, reason for visit: _________________________________________  * For Screening / Baseline only the questions are based on 30 days prior to consent.  SECTION B:  COVID-19 Like Illness Symptoms   Has the subject experienced any cold, flu or COVID-19 symptoms since the last study visit? [x]  No  (skip to section C)     []  Yes, date of onset:  _____/_____ MMM/YYYY    If yes, check all symptoms that apply:   []  Fevers or chills   []  New or Worsening Cough  []  Productive  []  Dry    []  If yes to cough, indicate severity  []  Constant  []  Occasional, several per hour   []  New or worsened shortness of breath   []  Diarrhea    []  Altered or reduced sense of smell or taste   []  Muscle aches/Severe Fatigue   []   Chest pain or tightness   []  Sore throat   []  Nausea or vomiting  SECTION C:  COVID-19 Like Illness Testing   Has the patient been tested for COVID-19 since the last study visit? [x]  No     []  Yes, date of test:  _____/_____ MMM/YYYY    If yes, test results:   []  Positive []  Negative []  Unknown  Has the patient been tested for COVID-19 Antibodies since the last study visit? [x]  No     []  Yes, date of test:  _____/_____ MMM/YYYY    If yes, test results:   []  Positive []  Negative []  Unknown  Has the patient been vaccinated for COVID-19 since the last study visit? [x]  No     []  Yes, date of test:  _____/_____ MMM/YYYY  Has the patient been tested for Influenza ("flu") since the last study visit? [x]  No     []  Yes, date of test:  _____/_____ MMM/YYYY    If yes, test results:   []  Positive []  Negative []  Unknown  Has the patient been vaccinated for Influenza ("flu") since the last study visit? [x]  No     []  Yes, date of test:  _____/_____ MMM/YYYY  SECTION D:  COVID-19 Like Illness Exposure  Has the subject been told that they might have had COVID-19/have symptoms suggestive of COVID-19 since the last study visit? [x]  No   []  Yes   []  Unknown  Has the subject been exposed to anyone with known or suspected COVID-19 since the last study visit? [x]  No   []  Yes   []  Unknown  Has the subject been told that they might have had the "flu" or influenza since the last study visit? [x]  No   []  Yes   []  Unknown  SECTION E:  Effects of COVID Pandemic on Subject's Healthcare Interactions  Since the last study visit, did the subject feel the need to go to an emergency department or hospital for their heart failure but decided not to because of concerns about COVID-19? [x]  No   []  Yes   []  Unknown    If yes, how did they seek care (check all that apply):   []  Telemedicine visit []  In-person []  Clinic []  Urgent  Care   []  Subject did not change hospital/ER use due to COVID-19 []  Other, specify: ________________________  Since the last study visit, did the subject have a cardiology/HF related appointment cancelled/rescheduled due to COVID-19 pandemic? [x]  No   []  Yes, how many? ___________   []  Unknown  Since the last study visit, did the subject have any cardiology/HF related telemedicine visit due to COVID-19 pandemic? [x]  No   []  Yes, how many? ___________   []  Unknown  Since the last study visit, did the subject have a cardiology/HF procedure cancelled/rescheduled due to COVID-19 pandemic? [x]  No   []  Yes, how many? ___________   []  Unknown  SECTION F:  Effects of COVID Pandemic on Subject's Medications  Since the last visit, did any of their heart failure medications change or stop, even for a short time?   If yes, enter change into medication eCRF [x]  No   []  Yes, how many? ___________   []  Unknown    If yes, why:   []  Instructed by Doctor []  Self-discontinued []  Unknown  Other: ________________  Since the last visit, was the subject prescribed any medications for COVID-19? [x]  No   []  Yes   []  Unknown    If yes, what medications (generic  name):  SECTION G:  Effects of COVID Pandemic on Subject's Lifestyle  How has the subject's activity/exercise level changed due to COVID-19 pandemic? [x]  No change   []  More activity/exercise   []  Less activity/exercise  How has the subject's smoking habits changed due to COVID-19? [x]  Does not smoke   []  No change   []  Smoke more   []  Smoke less  How has the subject's alcohol drinking habits changed due to COVID-19? [x]  Does not drink   []  No change   []  Drink more   []  Drink less  Section H:  Signature    Person completing form (Print Name): __________Jessica Yoseph Haile RN_________    Signature: __Jessica Amrit Cress RN________ Date: ____23-AUG-2022______________________      Outpatient Encounter Medications as of 02/19/2021  Medication Sig Note    allopurinol (ZYLOPRIM) 100 MG tablet Take 200 mg by mouth daily.    amLODipine (NORVASC) 5 MG tablet Take 5 mg by mouth in the morning. (Patient not taking: Reported on 02/07/2021)    aspirin 81 MG EC tablet Take 81 mg by mouth daily as needed ("for heart").    calcitRIOL (ROCALTROL) 0.5 MCG capsule Take 0.5 mcg by mouth every other day.    carvedilol (COREG) 25 MG tablet Take 50 mg by mouth in the morning and at bedtime.    Cholecalciferol (VITAMIN D-3) 25 MCG (1000 UT) CAPS Take 1,000 Units by mouth in the morning and at bedtime.    minoxidil (LONITEN) 2.5 MG tablet Take 2.5 mg by mouth daily.    Multiple Vitamin (MULTIVITAMIN PO) Take 1 tablet by mouth daily.    oxyCODONE-acetaminophen (PERCOCET/ROXICET) 5-325 MG tablet Take 1 tablet by mouth every 6 (six) hours as needed for moderate pain.    sacubitril-valsartan (ENTRESTO) 97-103 MG Take 1 tablet by mouth 2 (two) times daily. 01/24/2021: CORRECT STRENGTH, AS OF 01/24/2021   Semaglutide,0.25 or 0.5MG /DOS, (OZEMPIC, 0.25 OR 0.5 MG/DOSE,) 2 MG/1.5ML SOPN Inject 1.25 mg into the skin every Sunday.    torsemide (DEMADEX) 20 MG tablet Take 20 mg by mouth in the morning.    No facility-administered encounter medications on file as of 02/19/2021.

## 2021-02-19 NOTE — Progress Notes (Signed)
Section A:  Administrative Section  Subject ID: _[id number]_ - _021____ - 015 Subject Initials: ___[facility] ___ ___ Visit Interval:  (* = as required) []   Baseline     []  Implant/Pre D/C  Date of Report:   __23___/__AUG_____/__2022_____       (DD / MMM / )  [x]   1 Month     []  3 Month*  Name of ENT __Christopher MD_  []   6 Month*     []  12 Month*     []  Unscheduled*  Directions:  Baseline: complete sections B-E only   All other visits, complete all sections below (B-G)   Use comments box to document any abnormalities or changes from Baseline  Was the CVRx device turned off? [x]  Yes   []  No, specify reason why not: __________________________________  Was a laryngoscope used? [x]  Yes   []  No  Section B:  Dysphonia   Global Rating of Dysphonia: (Absence of recent upper respiratory infection or recent extensive voice abuse)   [x]   Normal (or clear)   []   Mild   []   Mild to Moderate   []   Moderate   []   Moderate to Severe   []   Severe   []   Aphonic  Section C:  Tongue/Pharynx   Assessment Observation Severity (complete only if abnormal); defined as:    Mild Significant impairment of functioning; patient is unable to carry out usual activities.    Moderate Patient experiences sufficient discomfort to interfere with or reduce their usual level of activity.    Severe Clinically evident impairment of functioning; patient is unable to carry out usual activities.  Pharynx at Rest [x]   Symmetric []   Mild   []  Asymmetric []  Moderate     []  Severe  Pharynx with Phonation ("ah") [x]   Symmetric []   Mild   []  Asymmetric []  Moderate     []  Severe  Tongue Atrophy [x]   Not Present []   Mild   []  Present []  Moderate     []  Severe  Tongue Mobility [x]   Normal []   Mild   []  Reduced []  Moderate     []  Severe  Tongue Protrusion [x]   Midline []   Mild Side: ____________   []  Deviation []  Moderate      []  Severe   Sensation of the Pharynx [x]   Normal []   Mild   []  Decreased []  Moderate     []   Severe  Section D:  House-Brackman Facial Paralysis Scale (Circle One)  Grade Impairment  I  [x]  Normal  II  []  Mild Dysfunction (slight weakness, normal symmetry at rest)  III  []  Moderate Dysfunction (obvious but not disfiguring weakness with synkinesis, normal symmetry at rest); Complete eye closure with maximal effort; Good forehead movement  IV  []  Moderately severe dysfunction (obvious and disfiguring asymmetry, significant synkinesis); Incomplete eye closure; moderate forehead movement  V  []  Severe dysfunction (barely perceptible motion  VI  []  Total paralysis (no movement)  Section E:  Baseline Cranial Nerve Damage  Does the subject have a presence of baseline cranial nerve dysfunction []  Yes, specify what dysfunction: _____________________________________   [x]  No  Section F:  ENT Notes (Pre-D/C and Follow-Up)  Were changes in Dysphonia from baseline or any abnormalities related to the [facility] Implant? []   Yes   [x]  No   []  Unknown   []  Not Applicable  Were changes in Tongue/Pharynx from baseline or any abnormalities related to the [facility] Implant? []   Yes   [x]   No   []  Unknown   []  Not Applicable  Were changes in House-Brackman Facial Paralysis Scale from baseline or any abnormalities related to the [facility] Implant? []   Yes   [x]  No   []  Unknown   []  Not Applicable  Were any of the above changes from baseline potentially related to the intubation for the procedure? []   Yes   [x]  No   []  Unknown   []  Not Applicable  Section G:  Cranial Nerves  Has there been new cranial nerve dysfunction since Baseline?   Marginal mandibular branch of cranial nerve VII (Facial Nerve) []  Yes, specify what changed: ___________________________________   [x]  No  Cranial nerve IX (Glossopharyngeal Nerve) []  Yes, specify what changed: ___________________________________   [x]  No  Cranial nerve X  (Vagus Nerve) []  Yes, specify what changed: ___________________________________   [x]  No   Cranial nerve XI  (Accessory Nerve) []  Yes, specify what changed: ___________________________________   [x]  No  Cranial nerve XII  (Hypoglossal Nerve) []  Yes, specify what changed: ___________________________________   [x]  No  Section H:  Comments/Notes/Describe any abnormalities/deficiencies (as well if post-operative change)

## 2021-03-21 ENCOUNTER — Encounter: Payer: Medicare Other | Admitting: *Deleted

## 2021-03-21 VITALS — BP 146/88 | HR 64 | Resp 18 | Wt 200.0 lb

## 2021-03-21 DIAGNOSIS — Z006 Encounter for examination for normal comparison and control in clinical research program: Secondary | ICD-10-CM

## 2021-03-21 NOTE — Research (Addendum)
Batwire 2 Month Visit  Patient came in for 2 Month post implant.  Patient is doing well with no issues.  Patient stated there was a change in his medications for his elevated pressures.  Blood pressure today is much better.  Next visit in 1 month   Section A:  Administrative Section  Subject ID: __[id number]__ - __021___ - 015 Subject Initials: __V_ _A__ _H__  Visit Interval:    []  Screening/Baseline    []  Activation   []  0.5 Month       []  1 Month     [x]  2 Month     []  3 Month       []  6 Month     []  12 Month         []  Unscheduled, reason for visit: _________________________________________  Section B:  Device Information   Battery Battery Voltage: 2.98 V   Battery Life: 34 Months   RRT Date: 21-Mar-2021 (DD/MMM/YYYY)  Lead Impeadance Right Lead __842_______ Ohms    []  Low    []  High   Left Lead _________ Ohms    []  Low    [x]  High  Programmed Settings Pathway Pulse Width Amplitude Frequency   []  Left 125 9 40   [x]  Right     Section C:  Programming Information (12 Month and Unscheduled - not required)  Date: _22___/__SEP_____/__2022___     (DD / MMM / ) Device information, therapy schedule and programmed settings can be found on the session summary report  Site Clinician Present: ____Jessica Daunte Oestreich RN_______________________________  [employer] Employee helping with programming: ____Parker Knight________ []  N/A  Location of [employer] person: [x]  []  Onsite Remote  Subject Experience   1. Did the subject experience transient bradycardia or hypotension during device testing? []  [x]  Yes No   2. Did the subject experience transient electrical stimulation of non-vascular tissues? []  [x]  Yes No   3. If yes to either question above, was intervention beyond reprogramming needed or was it associated with an additional untoward event? []  []  Yes No   4a. Is there any indication that there has been unacceptable device interaction between the [employer] device and other implanted electrical stimulators  or sensors device? []  [x]  []  Yes No N/A (no other device)   4b.                   Section D:  Arrythmia Interventions  Has the subject received a cardiac ablation since the last study visit? []  Yes  Date of last procedure:   ____/____/_____ (DD/MMM/YYYY)   [x]  No   []  Unknown   If yes, what type and # of ablations Type []  []  Atrial     []  []  Ventricular    # of procedures: ____________   Was this pre-planned prior to [employer] implant? []  Yes    []  No (complete/update AE form)  Has the subject received a cardioversion since the last study visit? []  Yes  Date of last procedure:   ____/____/_____ (DD/MMM/YYYY)   [x]  No   []  Unknown   If yes, what type and # of cardioversions? Type []  Medications     []  Electrical Shock    # of procedures: _____________   Was this pre-planned prior to [employer] implant? []  Yes    []   No (complete/update AE form)    Section E:  Adverse Events (N/A for Implant Interval)  Have any new adverse events or updates to existing events occurred since last visit? []   Yes (complete/update  AE form)   [x]   No  Section F:  Medication Changes   Have there been any changes to the subject's home use medications for Arrhythmia, Antiplatelet/Anticoagulation and Heart failure medications since the last visit? [x]  Yes (update Med form)   []  No  Section G:  Comments       N/A              Section H:  Signature    Person completing form (Print Name): ___Jessica Daeveon Zweber RN_______________    Signature: __Jessica Joangel Vanosdol RN____ Date: _____22-SEP-2022_____________________      Section A:  Administrative Section  Subject ID: __[id number]_______ - __021___ - 015 Subject Initials: _V__ _A__ _H__  Visit Interval:    []  Screening/Baseline    []  Activation   []  0.5 Month       []  1 Month     [x]  2 Month     []  3 Month       []  6 Month     []  12 Month         []  Unscheduled, reason for visit: _________________________________________  Section B:  Physical  Assessment   Date:    __22___/__SEP_____/___2022____   (DD / MMM / YYYY)  Weight: ____200________  []  kg    [x]  pounds Height (Screening Visit Only): ________  []  cm []  inches  Blood Pressure: _146_____  / _88_____mmHg Heart Rate: ____64______ bpm  Section E:  Signature     Person completing form (Print Name): ______Jessica Julea Hutto, RN_________________________________________________   Signature: ____Jessica Derrill Bagnell RN_____ Date: ____22-SEP-2022_________      SECTION A:  Administrative Section  Patient ID: _[id number]_ - __021___ - 015 Patient Initials: _V__ _A__ _H__  Visit Interval:    []  Screening/Baseline*    []  Activation   []  0.5 Month       []  1 Month     [x]  2 Month     []  3 Month       []  6 Month     []  12 Month         []  Unscheduled, reason for visit: _________________________________________  * For Screening / Baseline only the questions are based on 30 days prior to consent.  SECTION B:  COVID-19 Like Illness Symptoms   Has the subject experienced any cold, flu or COVID-19 symptoms since the last study visit? [x]  No  (skip to section C)     []  Yes, date of onset:  _____/_____ MMM/YYYY    If yes, check all symptoms that apply:   []  Fevers or chills   []  New or Worsening Cough  []  Productive  []  Dry    []  If yes to cough, indicate severity  []  Constant  []  Occasional, several per hour   []  New or worsened shortness of breath   []  Diarrhea   []  Altered or reduced sense of smell or taste   []  Muscle aches/Severe Fatigue   []  Chest pain or tightness   []  Sore throat   []  Nausea or vomiting  SECTION C:  COVID-19 Like Illness Testing   Has the patient been tested for COVID-19 since the last study visit? [x]  No     []  Yes, date of test:  _____/_____ MMM/YYYY    If yes, test results:   []  Positive []  Negative []  Unknown  Has the patient been tested for COVID-19 Antibodies since the last study visit? [x]  No     []  Yes, date of test:  _____/_____ MMM/YYYY  If yes, test  results:   []  Positive []  Negative []  Unknown  Has the patient been vaccinated for COVID-19 since the last study visit? [x]  No     []  Yes, date of test:  _____/_____ MMM/YYYY  Has the patient been tested for Influenza ("flu") since the last study visit? [x]  No     []  Yes, date of test:  _____/_____ MMM/YYYY    If yes, test results:   []  Positive []  Negative []  Unknown  Has the patient been vaccinated for Influenza ("flu") since the last study visit? [x]  No     []  Yes, date of test:  _____/_____ MMM/YYYY  SECTION D:  COVID-19 Like Illness Exposure  Has the subject been told that they might have had COVID-19/have symptoms suggestive of COVID-19 since the last study visit? [x]  No   []  Yes   []  Unknown  Has the subject been exposed to anyone with known or suspected COVID-19 since the last study visit? [x]  No   []  Yes   []  Unknown  Has the subject been told that they might have had the "flu" or influenza since the last study visit? [x]  No   []  Yes   []  Unknown  SECTION E:  Effects of COVID Pandemic on Subject's Healthcare Interactions  Since the last study visit, did the subject feel the need to go to an emergency department or hospital for their heart failure but decided not to because of concerns about COVID-19? [x]  No   []  Yes   []  Unknown    If yes, how did they seek care (check all that apply):   []  Telemedicine visit []  In-person []  Clinic []  Urgent Care   []  Subject did not change hospital/ER use due to COVID-19 []  Other, specify: ________________________  Since the last study visit, did the subject have a cardiology/HF related appointment cancelled/rescheduled due to COVID-19 pandemic? [x]  No   []  Yes, how many? ___________   []  Unknown  Since the last study visit, did the subject have any cardiology/HF related telemedicine visit due to COVID-19 pandemic? [x]  No   []  Yes, how many? ___________   []  Unknown  Since the last study visit, did the subject have a cardiology/HF  procedure cancelled/rescheduled due to COVID-19 pandemic? [x]  No   []  Yes, how many? ___________   []  Unknown  SECTION F:  Effects of COVID Pandemic on Subject's Medications  Since the last visit, did any of their heart failure medications change or stop, even for a short time?   If yes, enter change into medication eCRF []  No   [x]  Yes, how many? ____1_______   []  Unknown    If yes, why: change in therapy not due to covid   []  Instructed by Doctor []  Self-discontinued []  Unknown  Other: ________________  Since the last visit, was the subject prescribed any medications for COVID-19? [x]  No   []  Yes   []  Unknown    If yes, what medications (generic name):  SECTION G:  Effects of COVID Pandemic on Subject's Lifestyle  How has the subject's activity/exercise level changed due to COVID-19 pandemic? [x]  No change   []  More activity/exercise   []  Less activity/exercise  How has the subject's smoking habits changed due to COVID-19? [x]  Does not smoke   []  No change   []  Smoke more   []  Smoke less  How has the subject's alcohol drinking habits changed due to COVID-19? [x]  Does not drink   []  No change   []   Drink more   []  Drink less  Section H:  Signature    Person completing form (Print Name): _________Jessica Shaquasia Caponigro RN____________________    Signature: ____Jessica Flora Parks RN_______________ Date: ___22-SEP-2022_________     Outpatient Encounter Medications as of 03/21/2021  Medication Sig Note   bisoprolol (ZEBETA) 10 MG tablet Take 10 mg by mouth daily.    allopurinol (ZYLOPRIM) 100 MG tablet Take 200 mg by mouth daily.    amLODipine (NORVASC) 5 MG tablet Take 5 mg by mouth in the morning. (Patient not taking: Reported on 02/07/2021)    aspirin 81 MG EC tablet Take 81 mg by mouth daily as needed ("for heart").    calcitRIOL (ROCALTROL) 0.5 MCG capsule Take 0.5 mcg by mouth every other day.    carvedilol (COREG) 25 MG tablet Take 50 mg by mouth in the morning and at bedtime.     Cholecalciferol (VITAMIN D-3) 25 MCG (1000 UT) CAPS Take 1,000 Units by mouth in the morning and at bedtime.    minoxidil (LONITEN) 2.5 MG tablet Take 2.5 mg by mouth daily.    Multiple Vitamin (MULTIVITAMIN PO) Take 1 tablet by mouth daily.    oxyCODONE-acetaminophen (PERCOCET/ROXICET) 5-325 MG tablet Take 1 tablet by mouth every 6 (six) hours as needed for moderate pain.    sacubitril-valsartan (ENTRESTO) 97-103 MG Take 1 tablet by mouth 2 (two) times daily. 01/24/2021: CORRECT STRENGTH, AS OF 01/24/2021   Semaglutide,0.25 or 0.5MG /DOS, (OZEMPIC, 0.25 OR 0.5 MG/DOSE,) 2 MG/1.5ML SOPN Inject 1.25 mg into the skin every Sunday.    torsemide (DEMADEX) 20 MG tablet Take 20 mg by mouth in the morning.    No facility-administered encounter medications on file as of 03/21/2021.

## 2021-04-25 ENCOUNTER — Other Ambulatory Visit: Payer: Self-pay | Admitting: *Deleted

## 2021-04-25 DIAGNOSIS — Z006 Encounter for examination for normal comparison and control in clinical research program: Secondary | ICD-10-CM

## 2021-04-25 NOTE — Progress Notes (Signed)
Carotid order  

## 2021-04-26 ENCOUNTER — Ambulatory Visit (HOSPITAL_COMMUNITY): Payer: Medicare Other

## 2021-05-03 ENCOUNTER — Other Ambulatory Visit: Payer: Self-pay | Admitting: *Deleted

## 2021-05-03 DIAGNOSIS — Z006 Encounter for examination for normal comparison and control in clinical research program: Secondary | ICD-10-CM

## 2021-05-03 NOTE — Progress Notes (Signed)
Orders for carotid placed

## 2021-05-07 ENCOUNTER — Ambulatory Visit (HOSPITAL_COMMUNITY)
Admission: RE | Admit: 2021-05-07 | Discharge: 2021-05-07 | Disposition: A | Discharge status: Home / Self Care | Payer: Medicare Other | Source: Ambulatory Visit | Attending: Surgery | Admitting: Surgery

## 2021-05-07 ENCOUNTER — Other Ambulatory Visit: Payer: Self-pay

## 2021-05-07 ENCOUNTER — Encounter: Payer: Medicare Other | Admitting: *Deleted

## 2021-05-07 VITALS — BP 171/110 | HR 64

## 2021-05-07 DIAGNOSIS — Z006 Encounter for examination for normal comparison and control in clinical research program: Secondary | ICD-10-CM

## 2021-05-07 NOTE — Research (Signed)
Batwire  3 Month Visit  Patient came in today with c/o soreness at his surgical site.  Patient described that it happens more when he turns his head to the right to back a car.  Last visit patients device was turned up to 9.  Patient stated that he has been feeling this since we turned him up from his 2 Month visit.  At today's visit device was turned down to 7.8 patient stated that the soreness feels much better and that he wants to stay at this level until next visit.  Patient stated that other than the soreness he is feeling good.  Patient stated that he is a visit with his primary Cardiologist on 05/08/21.  Patient arrived with an elevated blood pressure.  Patient stated that his pharmacy was out of amlodipine and that he hasn't had it in a while. Instructed patient to discuss this with his primary cardiologist at his visit tomorrow  05/08/2021  I contacted patient on 05/08/21 to see how his visit with his primary cardiologist.  Patient stated that there were some medication changes.  We reviewed those changes and documented them appropriately.     Section A:  Administrative Section  Subject ID: __[id number]__ - __021 - 015 Subject Initials: _V__ _A__ _H__  Visit Interval:    []  Screening/Baseline    []  Activation   []  0.5 Month       []  1 Month     []  2 Month     [x]  3 Month       []  6 Month     []  12 Month         []  Unscheduled, reason for visit: _________________________________________  Section B:  Physical Assessment   Date:    __08___/__NOV_____/__2022_____   (DD / MMM / YYYY)  Weight: ___210_________  []  kg    [x]  pounds Height (Screening Visit Only): ________  []  cm []  inches  Blood Pressure: __171____  / ___110___mmHg Heart Rate: __64________ bpm  Section E:  Signature     Person completing form (Print Name): _____Jessica Ardean Simonich RN____________   Signature: ______Jessica Sanyah Molnar RN ____ Date: _____08-NOV-2022        Section A:  Administrative Section  Subject ID: __[id number]__ -  __021___ - 015 Subject Initials: _V__ _A__ _H__  Visit Interval:    []  Screening/Baseline    []  Activation   []  0.5 Month       []  1 Month     []  2 Month     [x]  3 Month       []  6 Month     []  12 Month         []  Unscheduled, reason for visit: _________________________________________  Section B:  Device Information   Battery Battery Voltage: 2.95 V   Battery Life: 46.3 Months   RRT Date: 07-May-2021 (DD/MMM/YYYY)  Lead Impeadance Right Lead __818_______ Ohms    []  Low    []  High   Left Lead _________ Ohms    []  Low    [x]  High  Programmed Settings Pathway Pulse Width Amplitude Frequency   []  Left 125 7 40   [x]  Right     Section C:  Programming Information (12 Month and Unscheduled - not required)  Date: _08___/_NOV______/__2022___     (DD / MMM / ) Device information, therapy schedule and programmed settings can be found on the session summary report  Site Clinician Present: _Jessica Hazlip__________________________________  helping with programming: _____Kyle Wolf_________ []  N/A  Location of [facility] person: [x]  []  Onsite Remote  Subject Experience   1. Did the subject experience transient bradycardia or hypotension during device testing? []  [x]  Yes No   2. Did the subject experience transient electrical stimulation of non-vascular tissues? []  [x]  Yes No   3. If yes to either question above, was intervention beyond reprogramming needed or was it associated with an additional untoward event? []  [x]  Yes No   4a. Is there any indication that there has been unacceptable device interaction between the CVRx device and other implanted electrical stimulators or sensors device? []  [x]  []  Yes No N/A (no other device)   4b.                   Section D:  Arrythmia Interventions  Has the subject received a cardiac ablation since the last study visit? []  Yes  Date of last procedure:   ____/____/_____ (DD/MMM/YYYY)   [x]  No   []  Unknown   If yes, what type  and # of ablations Type []  []  Atrial     []  []  Ventricular    # of procedures: ____________   Was this pre-planned prior to CVRx implant? []  Yes    []  No (complete/update AE form)  Has the subject received a cardioversion since the last study visit? []  Yes  Date of last procedure:   ____/____/_____ (DD/MMM/YYYY)   [x]  No   []  Unknown   If yes, what type and # of cardioversions? Type []  Medications     []  Electrical Shock    # of procedures: _____________   Was this pre-planned prior to CVRx implant? []  Yes    []   No (complete/update AE form)    Section E:  Adverse Events (N/A for Implant Interval)  Have any new adverse events or updates to existing events occurred since last visit? [x]   Yes (complete/update AE form)   []   No  Section F:  Medication Changes   Have there been any changes to the subject's home use medications for Arrhythmia, Antiplatelet/Anticoagulation and Heart failure medications since the last visit? [x]  Yes (update Med form)   []  No  Section G:  Comments                     Section H:  Signature    Person completing form (Print Name): ______Jessica Sheza Strickland RN____    Signature: __Jessica Jonea Bukowski RN_________ Date: ___08-NOV-2022________________         Section A:  Administrative Section   Subject ID:  _[id number]_ - _021____ - 015  Subject Initials:  _V__ _A__ _H__  Visit Interval:   (*if required)   [x]  3 Month       []  6 Month       []  12 Month       []  Unscheduled*   Section B:  CTA   If the subject had a CTA at baseline, a CTA at 25-month should be performed   Was a CTA performed?  Yes []  Not Done   [x]   CTA Date: _____/_______/_______                           (DD / MMM / Annamarie Major)  Images Sent to [facility]    []   Stenosis  Internal Carotid (ICA)  Distal Common Carotid [facility])   Right Side    _______%    _______%   Left Side    _______%  _______%   Name of person reading CTA:   Section C:  CDU   Was a CDU performed?  Yes    [x]     Not  Done []   CDU Date: __08___/_NOV______/__2022_____                           (DD / MMM / )  Images Sent to [facility]    [x]   Stenosis  Internal Carotid (ICA)  Distal Common Carotid [facility])   Right Side    ___<15____%    __< 15_____%   Left Side    __<15_____%    __<15_____%   Name of person reading CDU:   Section D:  New Stenosis   Is there new stenosis noted on CDU or CTA?  [x]  No  []  Yes (Specify side):       []  Left   []  Right  []  Both     If yes is there a greater than 50% stenosis in either artery?  [x]  No []  Yes (Specify side**):        []  Left   []  Right  []  Both   ** CTA should be completed if ?50% stenosis on the implanted side only at 76-month visit      Section E:  51-month Follow-up Only    Has the subject had new stenosis >40% since baseline in the artery where the lead was implanted?  []  No      []  Yes#, please indicate % new stenosis: _______%  (comment below)   Comments on determination of >40% stenosis:   # Subject must be followed after the 71-month visit, please see section 3.4.27 Final Study Visit at 12 Months   Section :  Comments            Section G:  Signature       Person completing form (Print Name): __Jessica Khaden Gater RN_____      Signature: _____Jessica Alondra Sahni RN  Date: _____08-NOV-2022_____      SECTION A:  Administrative Section  Patient ID: _[id number]_ - _021____ - 015 Patient Initials: _V__ _A__ _H__  Visit Interval:    []  Screening/Baseline*    []  Activation   []  0.5 Month       []  1 Month     []  2 Month     [x]  3 Month       []  6 Month     []  12 Month         []  Unscheduled, reason for visit: _________________________________________  * For Screening / Baseline only the questions are based on 30 days prior to consent.  SECTION B:  COVID-19 Like Illness Symptoms   Has the subject experienced any cold, flu or COVID-19 symptoms since the last study visit? [x]  No  (skip to section C)     []  Yes, date of onset:  _____/_____ MMM/YYYY    If yes, check  all symptoms that apply:   []  Fevers or chills   []  New or Worsening Cough  []  Productive  []  Dry    []  If yes to cough, indicate severity  []  Constant  []  Occasional, several per hour   []  New or worsened shortness of breath   []  Diarrhea   []  Altered or reduced sense of smell or taste   []  Muscle aches/Severe Fatigue   []  Chest pain or tightness   []  Sore throat   []  Nausea or vomiting  SECTION C:  COVID-19 Like Illness Testing   Has the patient been tested for COVID-19 since the last study visit? [x]  No     []  Yes, date of test:  _____/_____ MMM/YYYY    If yes, test results:   []  Positive []  Negative []  Unknown  Has the patient been tested for COVID-19 Antibodies since the last study visit? [x]  No     []  Yes, date of test:  _____/_____ MMM/YYYY    If yes, test results:   []  Positive []  Negative []  Unknown  Has the patient been vaccinated for COVID-19 since the last study visit? [x]  No     []  Yes, date of test:  _____/_____ MMM/YYYY  Has the patient been tested for Influenza ("flu") since the last study visit? [x]  No     []  Yes, date of test:  _____/_____ MMM/YYYY    If yes, test results:   []  Positive []  Negative []  Unknown  Has the patient been vaccinated for Influenza ("flu") since the last study visit? [x]  No     []  Yes, date of test:  _____/_____ MMM/YYYY  SECTION D:  COVID-19 Like Illness Exposure  Has the subject been told that they might have had COVID-19/have symptoms suggestive of COVID-19 since the last study visit? [x]  No   []  Yes   []  Unknown  Has the subject been exposed to anyone with known or suspected COVID-19 since the last study visit? [x]  No   []  Yes   []  Unknown  Has the subject been told that they might have had the "flu" or influenza since the last study visit? [x]  No   []  Yes   []  Unknown  SECTION E:  Effects of COVID Pandemic on Subject's Healthcare Interactions  Since the last study visit, did the subject feel the need to go to an emergency  department or hospital for their heart failure but decided not to because of concerns about COVID-19? [x]  No   []  Yes   []  Unknown    If yes, how did they seek care (check all that apply):   []  Telemedicine visit []  In-person []  Clinic []  Urgent Care   []  Subject did not change hospital/ER use due to COVID-19 []  Other, specify: ________________________  Since the last study visit, did the subject have a cardiology/HF related appointment cancelled/rescheduled due to COVID-19 pandemic? [x]  No   []  Yes, how many? ___________   []  Unknown  Since the last study visit, did the subject have any cardiology/HF related telemedicine visit due to COVID-19 pandemic? [x]  No   []  Yes, how many? ___________   []  Unknown  Since the last study visit, did the subject have a cardiology/HF procedure cancelled/rescheduled due to COVID-19 pandemic? [x]  No   []  Yes, how many? ___________   []  Unknown  SECTION F:  Effects of COVID Pandemic on Subject's Medications  Since the last visit, did any of their heart failure medications change or stop, even for a short time?   If yes, enter change into medication eCRF [x]  No   []  Yes, how many? ___________   []  Unknown    If yes, why:   []  Instructed by Doctor []  Self-discontinued []  Unknown  Other: ________________  Since the last visit, was the subject prescribed any medications for COVID-19? []  No   []  Yes   []  Unknown    If yes, what medications (generic name):  SECTION G:  Effects of COVID Pandemic on Subject's Lifestyle  How has the subject's activity/exercise level  changed due to COVID-19 pandemic? [x]  No change   []  More activity/exercise   []  Less activity/exercise  How has the subject's smoking habits changed due to COVID-19? [x]  Does not smoke   []  No change   []  Smoke more   []  Smoke less  How has the subject's alcohol drinking habits changed due to COVID-19? [x]  Does not drink   []  No change   []  Drink more   []  Drink less  Section H:  Signature     Person completing form (Print Name): _____Jessica Jaking Thayer RN_____________    Signature: __Jessica Ariv Penrod RN___________ Date: ___08-NOV-2022_______________________      Outpatient Encounter Medications as of 05/07/2021  Medication Sig Note   allopurinol (ZYLOPRIM) 100 MG tablet Take 200 mg by mouth daily.    amLODipine (NORVASC) 5 MG tablet Take 5 mg by mouth in the morning.    aspirin 81 MG EC tablet Take 81 mg by mouth daily as needed ("for heart").    bisoprolol (ZEBETA) 10 MG tablet Take 10 mg by mouth daily.    calcitRIOL (ROCALTROL) 0.5 MCG capsule Take 0.5 mcg by mouth every other day.    Cholecalciferol (VITAMIN D-3) 25 MCG (1000 UT) CAPS Take 1,000 Units by mouth in the morning and at bedtime.    Multiple Vitamin (MULTIVITAMIN PO) Take 1 tablet by mouth daily.    oxyCODONE-acetaminophen (PERCOCET/ROXICET) 5-325 MG tablet Take 1 tablet by mouth every 6 (six) hours as needed for moderate pain.    Semaglutide,0.25 or 0.5MG /DOS, (OZEMPIC, 0.25 OR 0.5 MG/DOSE,) 2 MG/1.5ML SOPN Inject 1.25 mg into the skin every Sunday.    torsemide (DEMADEX) 20 MG tablet Take 20 mg by mouth 2 (two) times daily. Take 2 tablets twice a day at 6 am and 2 pm by mouth    valsartan (DIOVAN) 160 MG tablet Take 160 mg by mouth 2 (two) times daily. Take 1 tablet in the morning and 1 tablet before bedtime by mouth    [DISCONTINUED] torsemide (DEMADEX) 20 MG tablet Take 20 mg by mouth in the morning.    [DISCONTINUED] carvedilol (COREG) 25 MG tablet Take 50 mg by mouth in the morning and at bedtime.    [DISCONTINUED] minoxidil (LONITEN) 2.5 MG tablet Take 2.5 mg by mouth daily.    [DISCONTINUED] sacubitril-valsartan (ENTRESTO) 97-103 MG Take 1 tablet by mouth 2 (two) times daily. 01/24/2021: CORRECT STRENGTH, AS OF 01/24/2021   No facility-administered encounter medications on file as of 05/07/2021.

## 2021-05-08 NOTE — Research (Signed)
Phillip Ruiz presented for device interrogation for the Batwire study on 05/07/2021.  His blood pressure on arrival was 171/110 We titrated the device down with the following pressures 162/100, 154/102, 151/109, 149/101, 157/107, 156/100.  Patient stated that he feels better since having the device turned down, he was having some soreness at the surgical site.    Message routed to patients Primary Cardiologist [place] Plan[place] in [place]  Evern Bio, RN BSN [facility] Cardiovascular Research & Education Direct Line: [phone]

## 2021-05-09 NOTE — Progress Notes (Signed)
Batwire #12 3 month exam  Patient aware of results. Will route results to his PCP about the thyroid enlarged, which was found prior to implant on CTA.

## 2021-05-09 NOTE — Progress Notes (Signed)
Please review CDU, due to enlarged thyroid.

## 2021-05-15 NOTE — Progress Notes (Signed)
Thanks I have removed as this patients PCP

## 2021-05-20 ENCOUNTER — Encounter: Payer: Self-pay | Admitting: *Deleted

## 2021-05-20 DIAGNOSIS — Z006 Encounter for examination for normal comparison and control in clinical research program: Secondary | ICD-10-CM

## 2021-05-20 NOTE — Research (Signed)
Patient was contacted today about unscheduled visit appointment for today.  Patient stated that he would be unable to come for today's appointment.  Patient stated that his neck was much better.  Patient stated that he still wanted to come in for an appointment but he would need to call back to make that appointment.  Will continue to monitor patient and await for follow-up call from patient

## 2021-07-05 ENCOUNTER — Other Ambulatory Visit: Payer: Self-pay | Admitting: *Deleted

## 2021-07-05 DIAGNOSIS — Z006 Encounter for examination for normal comparison and control in clinical research program: Secondary | ICD-10-CM

## 2021-07-17 ENCOUNTER — Other Ambulatory Visit: Payer: Self-pay

## 2021-07-17 ENCOUNTER — Ambulatory Visit (HOSPITAL_COMMUNITY)
Admission: RE | Admit: 2021-07-17 | Discharge: 2021-07-17 | Disposition: A | Discharge status: Home / Self Care | Payer: Medicare PPO | Source: Ambulatory Visit | Attending: Internal Medicine | Admitting: Internal Medicine

## 2021-07-17 ENCOUNTER — Ambulatory Visit (HOSPITAL_BASED_OUTPATIENT_CLINIC_OR_DEPARTMENT_OTHER)
Admission: RE | Admit: 2021-07-17 | Discharge: 2021-07-17 | Disposition: A | Discharge status: Home / Self Care | Payer: Medicare PPO | Source: Ambulatory Visit | Attending: Internal Medicine | Admitting: Internal Medicine

## 2021-07-17 ENCOUNTER — Encounter: Payer: Medicare Other | Admitting: *Deleted

## 2021-07-17 VITALS — BP 152/111 | HR 98 | Wt 205.0 lb

## 2021-07-17 DIAGNOSIS — Z006 Encounter for examination for normal comparison and control in clinical research program: Secondary | ICD-10-CM

## 2021-07-17 LAB — ECHOCARDIOGRAM COMPLETE
Calc EF: 28.3 %
S' Lateral: 5.5 cm
Single Plane A2C EF: 25.9 %
Single Plane A4C EF: 28.7 %

## 2021-07-17 NOTE — Research (Addendum)
Batwire 6 Month Follow-Up  Patient came in for follow-up 6 month post BatWIRE implant.  Patient had c/o feeling overloaded and "full" in his abdomen.  Patient reported that his weight on Sunday was 207lb and todays weight at visit was 205lb.  His last visit he was 201 lb.   Patient stated that his blood pressure has been elevated since his last visit and he has been working with his primary cardiologist to get better control of his pressure.  Patient had visit with his cardiologist on 07/18/2021 where his medications were adjusted and will be reevaluated in 30 days.  Patient is frustrated with his blood pressure being so high.  Research staff was able to coordinate with [facility] to get him established with their care.  He has an appt with them in Late February.  Patient stated that the discomfort in his neck has completely improved and he doesn't seem to have any issues with backing a car and discomfort in his neck.  We will continue to follow.  Update.  Patient called 07/19/21 to follow-up.  Patient stated that he is feeling a little better and the shortness of breath has improved and his most recent weight is now 202lb Section A:  Administrative Section  Subject ID: _[id number]_ - _021____ - 015 Subject Initials: _V__ _A__ _H__  Visit Interval:    []   Screening/Baseline    []  Activation   []  0.5 Month       []  1 Month     []  2 Month     []  3 Month       [x]  6 Month     []  12 Month         []  Unscheduled, reason for visit: _________________________________________  Section B:  Physical Assessment   Date:    _18____/_JAN______/__2023_____   (DD / MMM / YYYY)  Weight: __205__________  []  kg    [x]  pounds Height (Screening Visit Only): ________  []  cm []  inches  Blood Pressure: _168___  / _122__mmHg Heart Rate: ___97_______ bpm  Section E:  Signature    Person completing form (Print Name): ___Jessica Oracio Galen RN_____  Signature: ____Jessica Elvis Boot RN_____________ Date:  ___18-JAN-2023___       Section A:  Administrative Section  Subject ID: _[id number]__ - _021____ - 015 Subject Initials: _V__ _A__ _H__  Visit Interval:    []   Screening/Baseline    []  Activation   []  0.5 Month       []  1 Month     []  2 Month     []  3 Month       [x]  6 Month     []  12 Month         []  Unscheduled, reason for visit: _________________________________________  Section B:  Device Information   Battery Battery Voltage: 2.97 V   Battery Life: 31.6 Months   RRT Date: 17-Jul-2021 (DD/MMM/YYYY)  Lead Impeadance Right Lead __836_______ Ohms    []  Low    []  High   Left Lead _________ Ohms    []  Low    [x]  High  Programmed Settings Pathway Pulse Width Amplitude Frequency   []  Left 65 12.6 40   [x]  Right     Section C:  Programming Information (12 Month and Unscheduled - not required)  Date: _18___/_JAN______/_2023____     (DD / MMM / YYYY) Device information, therapy schedule and programmed settings can be found on the session summary report  Site Clinician Present: _____Jessica Harald Quevedo RN________________________________________  [employer] Employee helping with programming: ___Kyle Eliberto Ivory []  N/A  Location of [employer] person: [x]  []  Onsite Remote  Subject Experience   1. Did the subject experience transient bradycardia or hypotension during device testing? []  [x]  Yes No   2. Did the subject experience transient electrical stimulation of non-vascular tissues? []  [x]  Yes No   3. If yes to either question above, was intervention beyond reprogramming needed or was it associated with an additional untoward event? []  []  Yes No   4a. Is there any indication that there has been unacceptable device interaction between the [employer] device and other implanted electrical stimulators or sensors device? []  [x]  []  Yes No N/A (no other device)   4b. 4b. If Yes to question 4a, please describe the device interaction and corrective action taken:                  Section D:  Arrhythmia Interventions   Has the subject received a cardiac ablation since the last study visit? []  Yes  Date of last procedure:   ____/____/_____ (DD/MMM/YYYY)   [x]  No   []  Unknown   If yes, what type and # of ablations Type []  Atrial     []  Ventricular    # of procedures: ____________   Was this pre-planned prior to [employer] implant? []  Yes    [x]  No (complete/update AE form)  Has the subject received a cardioversion since the last study visit? []    Yes  Date of last procedure:   ____/____/_____ (DD/MMM/YYYY)   [x]  No   []  Unknown   If yes, what type and # of cardioversions? Type []  Medications     []  Electrical Shock    # of procedures: _____________   Was this pre-planned prior to [employer] implant? []  Yes    []  No (complete/update AE form)  Section E:  Adverse Events (N/A for Implant Interval)  Have any new adverse events or updates to existing events occurred since last visit? []  Yes (complete/update AE form)   [x]  No  Section F:  Medication Changes   Have there been any changes to the subject's home use medications for Arrhythmia, Antiplatelet/Anticoagulation and Heart failure medications since the last visit? []  Yes (update Med form)   [x]  No  Section G:  Were Fluoroscopy or X-ray images done? []  Yes []  Images sent to [employer] (see Fluoroscopy/X-ray Worksheet)   [x]  NoD   Section H:  Comments     N/a      Section I:  Signature   Person completing form (Print Name): __Jessica Gaylynn Seiple RN_____________________________  Signature: ______Jessica Zendaya Groseclose RN_______________ Date: _18-JAN-2023_________________________       SECTION A:  Administrative Section  Patient ID: [id number] - _021____ - 015 Patient Initials: _V__ _A__ _H__  Visit Interval:    []   Screening/Baseline*    []  Activation   []  0.5 Month       []  1 Month     []  2 Month     []  3 Month       [x]  6 Month     []  12 Month         []  Unscheduled, reason for visit: _________________________________________  * For Screening / Baseline only the  questions are based on 30 days prior to consent.  SECTION B:  COVID-19 Like Illness Symptoms   Has the subject experienced any cold, flu or COVID-19 symptoms since the last study visit? [x]   No  (skip to section C)     []   Yes, date of onset:  _____/_____ MMM/YYYY    If yes, check all symptoms that apply:   []  Fevers or chills   []   New or Worsening Cough  []  Productive  []  Dry     If yes to cough, indicate severity  []  Constant  []  Occasional, several per hour   []  New or worsened shortness of breath   []  Diarrhea   []  Altered or reduced sense of smell or taste   []  Muscle aches/Severe Fatigue   []  Chest pain or tightness   []  Sore throat   []  Nausea or vomiting  SECTION C:  COVID-19 Like Illness Testing   Has the patient been tested for COVID-19 since the last study visit? [x]  No     []  Yes, date of test:  _____/_____ MMM/YYYY    If yes, test results:   []  Positive []  Negative []  Unknown  Has the patient been tested for COVID-19 Antibodies since the last study visit? [x]  No     []  Yes, date of test:  _____/_____ MMM/YYYY    If yes, test results:   []  Positive []  Negative []  Unknown  Has the patient been vaccinated for COVID-19 since the last study visit? [x]  No     []  Yes, date of test:  _____/_____ MMM/YYYY  Has the patient been tested for Influenza (flu) since the last study visit? [x]  No     []  Yes, date of test:  _____/_____ MMM/YYYY    If yes, test results:   []  Positive []  Negative []  Unknown  Has the patient been vaccinated for Influenza (flu) since the last study visit? []  No     [x]  Yes, date of test:  _JAN__/_2023____ MMM/YYYY  SECTION D:  COVID-19 Like Illness Exposure  Has the subject been told that they might have had COVID-19/have symptoms suggestive of COVID-19 since the last study visit? [x]    No   []  Yes   []   Unknown  Has the subject been exposed to anyone with known or suspected COVID-19 since the last study visit? [x]    No   []  Yes   []   Unknown   Has the subject been told that they might have had the flu or influenza since the last study visit? [x]    No   []  Yes   []   Unknown  SECTION E:  Effects of COVID Pandemic on Subject's Healthcare Interactions  Since the last study visit, did the subject feel the need to go to an emergency department or hospital for their heart failure but decided not to because of concerns about COVID-19? [x]   No   []  Yes   []  Unknown    If yes, how did they seek care (check all that apply):   []  Telemedicine visit []  In-person []  Clinic []  Urgent Care   []  Subject did not change hospital/ER use due to COVID-19 []  Other, specify: ________________________  Since the last study visit, did the subject have a cardiology/HF related appointment cancelled/rescheduled due to COVID-19 pandemic? [x]   No   []  Yes, how many? ___________   []  Unknown  Since the last study visit, did the subject have any cardiology/HF related telemedicine visit due to COVID-19 pandemic? [x]   No   []  Yes, how many? ___________   []  Unknown  Since the last study visit, did the subject have a cardiology/HF procedure cancelled/rescheduled due to COVID-19 pandemic? [x]   No   []  Yes, how many? ___________   []  Unknown  SECTION F:  Effects of COVID Pandemic on Subject's Medications  Since the last visit, did any of their heart failure medications change or stop, even for a short time?   If yes, enter change into medication eCRF [x]   No   []  Yes, how many? ___________   []  Unknown    If yes, why:   []  Instructed by Doctor []  Self-discontinued []  Unknown []  Other: ________________  Since the last visit, was the subject prescribed any medications for COVID-19? [x]   No   []  Yes   []  Unknown    If yes, what medications (generic name):  SECTION G:  Effects of COVID Pandemic on Subject's Lifestyle  How has the subject's activity/exercise level changed due to COVID-19 pandemic? [x]   No change   []  More activity/exercise   []  Less  activity/exercise  How has the subject's smoking habits changed due to COVID-19? [x]   Does not smoke   []  No change   []  Smoke more   []  Smoke less  How has the subject's alcohol drinking habits changed due to COVID-19? [x]   Does not drink   []  No change   []  Drink more   []  Drink less  Section H:  Signature  Person completing form (Print Name): ____Jessica Daniyal Tabor RN______________  Signature: ____Jessica Normagene Harvie RN_____ Date: ____18-JAN-2023______________     Section A:  Administrative Section  Subject ID: _[id number]_ - _021_ - 015 Subject Initials: _V__ _A__ _H__ Visit Interval:  []   Baseline     [x]  6 Month  Section B:  NYHA   NYHA Classification Date:    _18____/__JAN_____/_2023______  (DD / MMM / YYYY)  Select One Class Subject Symptoms  []  I No limitations of physical activity, No undue fatigue, palpitation or dyspnea  []  II Slight limitation of physical activity, Comfortable at rest, Less than ordinary activity results in fatigue, Palpitation, or dyspnea  [x]  III Marked limitation of physical activity, Comfortable at rest, Less than ordinary activity results in fatigue, palpitation, or dyspnea  []  IV Unable to carry out any physical activity without discomfort, Symptoms of cardiac insufficiency at rest, Physical activity causes increased discomfort  Name of person conducting NYHA: ____Kimberly Alba Destine, RN__________________  Section C:  6MHW   Six Minute Hall Walk Test Date:    _18____/_JAN______/_2023______  (DD / MMM / [place] [facility])  Distance Walked ____365________ meters  Were there any devices used to assist the subject in walking (e.g. cane, walker, etc.)? [x]  No   []  Yes specify:_______________________  Was the walk terminated before 6 minutes? [x]  No   []  Yes specify reason: (select all that apply)    []  Angina    []   Dyspnea    []   Fatigue    []   Dizziness    []   Syncope    []   Other, specify:  Name of person conducting 6-Minute Hall Walk: ____Jessica Jaythan Hinely RN___________   Section D:  Signature    Person completing form (Print Name): _____Jessica Taim Wurm RN___________________    Signature: ____Jessica Aubrii Sharpless RN_________________ Date: ___18-JAN-2023_________       Section A:  Administrative Section  Subject ID: _[id number]_ - _021_ - 015 Subject Initials: _V__ _A__ _H__ Visit Interval:  []   Baseline     [x]  6 Month     []  12 Month  Section B:  Cardiac Echo   Cardiac Echo Date:  _18_/_JAN____/__2023_____ (DD / MMM / YYYY)  [x]  Images sent to [facility]  Heart rate ___92_______ bpm Echo type: [x]  2D Images Captured: (4-Chamber  and Biplane required) [x]  PS-LAX  Blood Pressure _186____/_120____ mmHg  [x]  3D  [x]  Biplane        [x]  Apical 4-Chamber        []  Other, specify:_______   LV End Diastolic Dimension 99991111 mm   LV End Systolic Dimension Q000111Q mm   LA Volume ____41___________ mL/m2   LV End-Diastolic Volume 99991111 mL   LV End-Systolic Volume [id number] mL   LV Ejection Fraction ____20___________ %  Section C:  Comments  Values Confirmed          Section C:  Signature   Person completing form (Print Name): ___Jessica Tausha Milhoan RN____________   Signature: ___Jessica Shakyra Mattera RN__________ Date: ____18-JAN-2023______________________       Section A:  Administrative Section   Subject ID:  _[id number]_ - _021____ - 015  Subject Initials:  _V__ _A__ _H__  Visit Interval:   (*if required)   []  3 Month       [x]  6 Month       []  12 Month       []  Unscheduled*   Section B:  CTA   If the subject had a CTA at baseline, a CTA at 29-month should be performed   Was a CTA performed?  Yes []  Not Done   [x]   CTA Date: _____/_______/_______                           (DD / MMM / [place] [facility])  Images Sent to [facility]    []   Stenosis  Internal Carotid (ICA)  Distal Common Carotid [facility])   Right Side    _______%    _______%   Left Side    _______%    _______%   Name of person reading CTA:   Section C:  CDU   Was a CDU performed?   Yes    [x]     Not Done []   CDU Date: _18____/__JAN_____/_______                           (DD / MMM / [place] [facility])  Images Sent to [facility]    [x]   Stenosis  Internal Carotid (ICA)  Distal Common Carotid [facility])   Right Side    __< 15_____%    __< 15_____%   Left Side    _<15______%    _<15______%   Name of person reading CDU:   Section D:  New Stenosis   Is there new stenosis noted on CDU or CTA?  [x]  No  []  Yes (Specify side):       []  Left   []  Right  []  Both     If yes is there a greater than 50% stenosis in either artery?  [x]  No []  Yes (Specify side**):        []  Left   []  Right  []  Both   ** CTA should be completed if ?50% stenosis on the implanted side only at 33-month visit      Section E:  15-month Follow-up Only    Has the subject had new stenosis >40% since baseline in the artery where the lead was implanted?  [x]  No      []  Yes#, please indicate % new stenosis: _______%  (comment below)   Comments on determination of >40% stenosis:   # Subject must be followed after the 45-month visit, please see section 3.4.27 Final Study Visit at 12 Months   Section :  Comments    n/a        Section G:  Signature       Person completing form (Print Name): __Jessica Earmon Sherrow RN_______      Signature: __Jessica Lamesha Tibbits RN_____    Date: __18-JAN-2023_    Outpatient Encounter Medications as of 07/17/2021  Medication Sig   amLODipine (NORVASC) 10 MG tablet Take 10 mg by mouth daily.   carvedilol (COREG) 25 MG tablet Take 25 mg by mouth 2 (two) times daily. Take 1 tablet in the morning and 1 tablet before bedtime by mouth.  Do this for 30 days.   Dulaglutide (TRULICITY) A999333 0000000 SOPN Inject 0.75 mg as directed.   insulin detemir (LEVEMIR FLEXTOUCH) 100 UNIT/ML FlexPen Inject 18 Units into the skin 1 day or 1 dose.   sacubitril-valsartan (ENTRESTO) 49-51 MG Take 49-51 tablets by mouth 2 (two) times daily.   torsemide (DEMADEX) 20 MG tablet Take 40 mg by mouth daily.   allopurinol (ZYLOPRIM)  100 MG tablet Take 200 mg by mouth daily.   aspirin 81 MG EC tablet Take 81 mg by mouth daily as needed ("for heart").   Multiple Vitamin (MULTIVITAMIN PO) Take 1 tablet by mouth daily.   sildenafil (VIAGRA) 100 MG tablet Take 100 mg by mouth as needed. For Sexual activity   [DISCONTINUED] amLODipine (NORVASC) 5 MG tablet Take 5 mg by mouth in the morning.   [DISCONTINUED] bisoprolol (ZEBETA) 10 MG tablet Take 10 mg by mouth daily.   [DISCONTINUED] calcitRIOL (ROCALTROL) 0.5 MCG capsule Take 0.5 mcg by mouth every other day.   [DISCONTINUED] Cholecalciferol (VITAMIN D-3) 25 MCG (1000 UT) CAPS Take 1,000 Units by mouth in the morning and at bedtime.   [DISCONTINUED] oxyCODONE-acetaminophen (PERCOCET/ROXICET) 5-325 MG tablet Take 1 tablet by mouth every 6 (six) hours as needed for moderate pain.   [DISCONTINUED] Semaglutide,0.25 or 0.5MG /DOS, (OZEMPIC, 0.25 OR 0.5 MG/DOSE,) 2 MG/1.5ML SOPN Inject 1.25 mg into the skin every Sunday.   [DISCONTINUED] torsemide (DEMADEX) 20 MG tablet Take 20 mg by mouth 2 (two) times daily. Take 2 tablets twice a day at 6 am and 2 pm by mouth   [DISCONTINUED] valsartan (DIOVAN) 160 MG tablet Take 160 mg by mouth 2 (two) times daily. Take 1 tablet in the morning and 1 tablet before bedtime by mouth   No facility-administered encounter medications on file as of 07/17/2021.

## 2021-07-18 NOTE — Progress Notes (Signed)
Will route this and echo to pcp and cardiologist. We are also working on getting him set up to see AHF clinic.  Cala Bradford :)

## 2021-07-18 NOTE — Progress Notes (Signed)
Batwire patient 40M follow up 021

## 2021-07-19 ENCOUNTER — Other Ambulatory Visit: Payer: Self-pay

## 2021-07-31 ENCOUNTER — Encounter: Payer: Self-pay | Admitting: *Deleted

## 2021-07-31 DIAGNOSIS — Z006 Encounter for examination for normal comparison and control in clinical research program: Secondary | ICD-10-CM

## 2021-07-31 NOTE — Research (Addendum)
BatWire  Telephone call from patient, stating that he is still feeling volume overloaded. His stated that his weight today was 203 lb and his baseline is normally around 197l b.  Patient stated that he is not feeling well and wants to come in for tiitration.  Patient stated that he has been taking 40 mg of torsemide in the morning and then 20 mg torsemide at 2 pm.  His primary cardiologist adjusted his medication list to taking carvedilol 25 mg BID.  Patient stated that his diastolic pressure is in the 60s which is significantly decreased from his past diastolic pressures in > 110.  Patient stated that he stopped the Carvedilol and contacted his primary cardiologist Morton Stall MD in [place]).  Patient stated that his cardiologist was ok with him stopping the medication due to dizziness.  Patient will be brought in for device titration and will attempt to move his future Advanced HF appointment sooner than the 08/20/21 appt he currently has    Evern Bio, RN BSN [facility] Cardiovascular Research & Education Direct Line: [phone]

## 2021-08-09 ENCOUNTER — Encounter: Payer: Medicare PPO | Admitting: *Deleted

## 2021-08-09 VITALS — BP 163/113 | HR 94 | Wt 204.0 lb

## 2021-08-09 DIAGNOSIS — Z006 Encounter for examination for normal comparison and control in clinical research program: Secondary | ICD-10-CM

## 2021-08-09 NOTE — Research (Signed)
Patient came in today with some swelling in abdomen and shortness of breath with exertion, but feeling some better today.   His cardiologist had him increase his diuretic on 07/29/2021 told him to take 2 Torsemide in the am and 1 at 2 pm for rest of week. He said it helped him but still retaining fluid. He told me his weight yesterday was 211 and today ws 204. I asked him what meds he was taking and he said only the Entresto and Torsemide. He is currently out of his Sherryll Burger and has been taking his moms which is the low dose. He was going to go by pharmacy and try to pick up his meds today. I told him to call his cardiologist to see if they have samples because his Sherryll Burger was $50 and he didn't have that at the time.   We reviewed his meds that he brought in from.  He brought in Bisoprolol, Carvedilol, Torsemide, Amlodipine, Allopurinol, Trazodone, Minoxidil.  With all these meds the only one he is taking is Torsemide, and he is taking his moms Entresto low dose. I asked him why he wasn't taking the carvedilol and he said when he took it he felt like he was going to pass out and his BP bottom number was 60. I suggested to patient to call his cardiologist and let him know what was going on.   We adjusted his device today to try and help with his volume overload and shortness of breath. He is scheduled to see Dr Gala Romney in 2 weeks.  Starting of visit his BP was 163/113 after we adjusted his device his BP was 152/103.   Section A:  Administrative Section  Subject ID: _[id number]_ - _021_ - 015 Subject Initials: _V_ _A_ _H__  Visit Interval:    []   Screening/Baseline    []  Activation   []  0.5 Month       []  1 Month     []  2 Month     []  3 Month       []  6 Month     []  12 Month         [x]  Unscheduled, reason for visit: __shortness of breath and swelling _______________________________________  Section B:  Physical Assessment   Date:    _10_/_FEB_/_2023_   (DD / MMM / )  Weight: _204_  []  kg    [x]   pounds Height (Screening Visit Only): ________  []  cm []  inches  Blood Pressure: _163_  / _113_mmHg Heart Rate: _94_ bpm  Section E:  Signature    Person completing form (Print Name): :)______________  Signature: :) __ Date: _02/10/2023________     Section A:  Administrative Section  Subject ID: _[id number]__ - _021_ - 015 Subject Initials: _V_ _A_ _H_  Visit Interval:    []   Screening/Baseline    []  Activation   []  0.5 Month       []  1 Month     []  2 Month     []  3 Month       []  6 Month     []  12 Month         [x]  Unscheduled, reason for visit: _________________________________________  Section B:  Device Information   Battery Battery Voltage: 2.95 V   Battery Life: 31 Months   RRT Date: 19-Mar-2024 (DD/MMM/YYYY)  Lead Impeadance Right Lead _606_ Ohms    []  Low    []  High   Left Lead ______ Ohms    []   Low    [x]  High  Programmed Settings Pathway Pulse Width Amplitude Frequency   []  Left 65 15 40   [x]  Right     Section C:  Programming Information (12 Month and Unscheduled - not required)  Date: _10_/_FEB_/_2023_     (DD / MMM / YYYY) Device information, therapy schedule and programmed settings can be found on the session summary report  Site Clinician Present: :) _____________________________  [employer] Employee helping with programming: _Kyle Wolf__________ []  N/A  Location of [employer] person: [x]  []  Onsite Remote  Subject Experience   1. Did the subject experience transient bradycardia or hypotension during device testing? []  [x]  Yes No   2. Did the subject experience transient electrical stimulation of non-vascular tissues? [x]  []  Yes No   3. If yes to either question above, was intervention beyond reprogramming needed or was it associated with an additional untoward event? []  [x]  Yes No   4a. Is there any indication that there has been unacceptable device interaction between the [employer] device and other implanted  electrical stimulators or sensors device? []  [x]  []  Yes No N/A (no other device)   4b. 4b. If Yes to question 4a, please describe the device interaction and corrective action taken:                  Section D:  Arrhythmia Interventions  Has the subject received a cardiac ablation since the last study visit? []  Yes  Date of last procedure:   ____/____/_____ (DD/MMM/YYYY)   [x]  No   []  Unknown   If yes, what type and # of ablations Type []  Atrial     []  Ventricular    # of procedures: ____________   Was this pre-planned prior to [employer] implant? []  Yes    []  No (complete/update AE form)  Has the subject received a cardioversion since the last study visit? []    Yes  Date of last procedure:   ____/____/_____ (DD/MMM/YYYY)   [x]  No   []  Unknown   If yes, what type and # of cardioversions? Type []  Medications     []  Electrical Shock    # of procedures: _____________   Was this pre-planned prior to [employer] implant? []  Yes    []  No (complete/update AE form)  Section E:  Adverse Events (N/A for Implant Interval)  Have any new adverse events or updates to existing events occurred since last visit? [x]  Yes (complete/update AE form)   []  No  Section F:  Medication Changes   Have there been any changes to the subject's home use medications for Arrhythmia, Antiplatelet/Anticoagulation and Heart failure medications since the last visit? [x]  Yes (update Med form)   []  No  Section G:  Were Fluoroscopy or X-ray images done? []  Yes []  Images sent to [facility] (see Fluoroscopy/X-ray Worksheet)   []  NoD   Section H:  Comments     N/A      Section I:  Signature   Person completing form (Print Name): :) ________________ Signature: :) ________ Date: __02/10/2023______________

## 2021-08-19 NOTE — Progress Notes (Signed)
Patient did not show for appt. Note left for templating purposes only     ADVANCED HF CLINIC CONSULT NOTE  Referring Physician: Primary Care: No primary care provider on file. 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       Review of Systems: [y] = yes, [ ]  = no   General: Weight gain [ ] ; Weight loss [ ] ; Anorexia [ ] ; Fatigue [ ] ; Fever [ ] ; Chills [ ] ; Weakness [ ]   Cardiac: Chest pain/pressure [ ] ; Resting SOB [ ] ; Exertional SOB [ ] ; Orthopnea [ ] ; Pedal Edema [ ] ; Palpitations [ ] ; Syncope [ ] ; Presyncope [ ] ; Paroxysmal nocturnal dyspnea[ ]   Pulmonary: Cough [ ] ; Wheezing[ ] ; Hemoptysis[ ] ; Sputum [ ] ; Snoring [ ]   GI: Vomiting[ ] ; Dysphagia[ ] ; Melena[ ] ; Hematochezia [ ] ; Heartburn[ ] ; Abdominal pain [ ] ; Constipation [ ] ; Diarrhea [ ] ; BRBPR [ ]   GU: Hematuria[ ] ; Dysuria [ ] ; Nocturia[ ]   Vascular: Pain in legs with walking [ ] ; Pain in feet with lying flat [ ] ; Non-healing sores [ ] ; Stroke [ ] ; TIA [ ] ; Slurred speech [ ] ;  Neuro: Headaches[ ] ; Vertigo[ ] ; Seizures[ ] ; Paresthesias[ ] ;Blurred vision [ ] ; Diplopia [ ] ; Vision changes [ ]   Ortho/Skin: Arthritis [ ] ; Joint pain [ ] ; Muscle pain [ ] ; Joint swelling [ ] ; Back Pain [ ] ; Rash [ ]   Psych: Depression[ ] ; Anxiety[ ]   Heme: Bleeding problems [ ] ; Clotting disorders [ ] ; Anemia [ ]   Endocrine: Diabetes [ ] ; Thyroid dysfunction[ ]    Past Medical History:  Diagnosis Date   Acne    Nec   Anxiety    Cardiomyopathy (HCC)    Chronic combined systolic and diastolic congestive heart failure, NYHA class 3 (HCC)    CKD (chronic kidney disease), stage III (HCC)    DM type 2 (diabetes mellitus, type 2) (HCC)    HFrEF (heart failure with reduced ejection fraction) (HCC)    History of gout    Hypertension, essential, benign    ICD (implantable  cardioverter-defibrillator) in place    Insomnia    Nec   LVH (left ventricular hypertrophy)    Poor compliance    SOB (shortness of breath) on exertion    Uncontrolled hypertension     Current Outpatient Medications  Medication Sig Dispense Refill   allopurinol (ZYLOPRIM) 100 MG tablet Take 200 mg by mouth daily.     amLODipine (NORVASC) 10 MG tablet Take 10 mg by mouth daily.     aspirin 81 MG EC tablet Take 81 mg by mouth daily as needed ("for heart").     Dulaglutide (TRULICITY) 0.75 MG/0.5ML SOPN Inject 0.75 mg as directed.     insulin detemir (LEVEMIR FLEXTOUCH) 100 UNIT/ML FlexPen Inject 18 Units into the skin 1 day or 1 dose.     Multiple Vitamin (MULTIVITAMIN PO) Take 1 tablet by mouth daily.     sacubitril-valsartan (ENTRESTO) 49-51 MG Take 49-51 tablets by mouth 2 (two) times daily.     sildenafil (VIAGRA) 100 MG tablet Take 100 mg by mouth as needed. For Sexual activity     torsemide (DEMADEX) 20 MG tablet Take 40 mg by mouth daily.     No current facility-administered medications for this encounter.    No Known  Allergies    Social History   Socioeconomic History   Marital status: Divorced    Spouse name: Not on file   Number of children: Not on file   Years of education: Not on file   Highest education level: Not on file  Occupational History   Not on file  Tobacco Use   Smoking status: Never   Smokeless tobacco: Never  Vaping Use   Vaping Use: Never used  Substance and Sexual Activity   Alcohol use: Never   Drug use: Never   Sexual activity: Not on file  Other Topics Concern   Not on file  Social History Narrative   Not on file   Social Determinants of Health   Financial Resource Strain: Not on file  Food Insecurity: Not on file  Transportation Needs: Not on file  Physical Activity: Not on file  Stress: Not on file  Social Connections: Not on file  Intimate Partner Violence: Not on file      Family History  Problem Relation Age of Onset    Stroke Mother    Hypertension Mother    Stroke Father    Hypertension Father    Heart disease Maternal Grandfather    Diabetes Paternal Grandmother    Heart disease Paternal Grandmother    Diabetes Paternal Grandfather    Stroke Paternal Grandfather     There were no vitals filed for this visit.  PHYSICAL EXAM: General:  Well appearing. No respiratory difficulty HEENT: normal Neck: supple. no JVD. Carotids 2+ bilat; no bruits. No lymphadenopathy or thryomegaly appreciated. Cor: PMI nondisplaced. Regular rate & rhythm. No rubs, gallops or murmurs. Lungs: clear Abdomen: soft, nontender, nondistended. No hepatosplenomegaly. No bruits or masses. Good bowel sounds. Extremities: no cyanosis, clubbing, rash, edema Neuro: alert & oriented x 3, cranial nerves grossly intact. moves all 4 extremities w/o difficulty. Affect pleasant.  ECG:   ASSESSMENT & PLAN:  1. Chronic systolic HF due to NICM (suspect hypertensive) - Echo 5/22 EF 20-25%. Mild AS, moderate MS. RV normal - NYHA .Marland Kitchen. - volume status ... on torsemide 40 daily - Continue Entresto 49/51 BID - Continue carvedilol 25 bid  2. HTN, uncontrolled - needs sleep study  3. 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 DM2  - uncontrolled - continue Trylicity - consider [id number]

## 2021-08-20 ENCOUNTER — Inpatient Hospital Stay (HOSPITAL_COMMUNITY)
Admission: RE | Admit: 2021-08-20 | Discharge: 2021-08-20 | Disposition: A | Discharge status: Home / Self Care | Payer: Medicare PPO | Source: Ambulatory Visit | Attending: Internal Medicine | Admitting: Internal Medicine

## 2021-11-22 NOTE — Research (Signed)
Updated:  Patient had medication change. Medication log is correct in EDC.

## 2021-12-25 ENCOUNTER — Other Ambulatory Visit: Payer: Self-pay | Admitting: *Deleted

## 2021-12-25 DIAGNOSIS — Z006 Encounter for examination for normal comparison and control in clinical research program: Secondary | ICD-10-CM

## 2021-12-25 NOTE — Progress Notes (Signed)
Orders placed.

## 2022-01-21 ENCOUNTER — Ambulatory Visit (HOSPITAL_COMMUNITY)
Admission: RE | Admit: 2022-01-21 | Discharge: 2022-01-21 | Disposition: A | Discharge status: Home / Self Care | Payer: Medicare HMO | Source: Ambulatory Visit | Attending: Internal Medicine | Admitting: Internal Medicine

## 2022-01-21 ENCOUNTER — Encounter: Payer: Medicare PPO | Admitting: *Deleted

## 2022-01-21 ENCOUNTER — Ambulatory Visit (HOSPITAL_BASED_OUTPATIENT_CLINIC_OR_DEPARTMENT_OTHER)
Admission: RE | Admit: 2022-01-21 | Discharge: 2022-01-21 | Disposition: A | Discharge status: Home / Self Care | Payer: Medicare HMO | Source: Ambulatory Visit | Attending: Internal Medicine | Admitting: Internal Medicine

## 2022-01-21 VITALS — BP 163/89 | HR 71 | Wt 205.0 lb

## 2022-01-21 DIAGNOSIS — Z006 Encounter for examination for normal comparison and control in clinical research program: Secondary | ICD-10-CM

## 2022-01-21 DIAGNOSIS — I2510 Atherosclerotic heart disease of native coronary artery without angina pectoris: Secondary | ICD-10-CM | POA: Insufficient documentation

## 2022-01-21 DIAGNOSIS — E119 Type 2 diabetes mellitus without complications: Secondary | ICD-10-CM | POA: Insufficient documentation

## 2022-01-21 DIAGNOSIS — I10 Essential (primary) hypertension: Secondary | ICD-10-CM | POA: Insufficient documentation

## 2022-01-21 DIAGNOSIS — M489 Spondylopathy, unspecified: Secondary | ICD-10-CM | POA: Insufficient documentation

## 2022-01-21 DIAGNOSIS — E049 Nontoxic goiter, unspecified: Secondary | ICD-10-CM | POA: Insufficient documentation

## 2022-01-21 LAB — POCT I-STAT CREATININE: Creatinine, Ser: 1.8 mg/dL — ABNORMAL HIGH (ref 0.61–1.24)

## 2022-01-21 LAB — ECHOCARDIOGRAM COMPLETE
AR max vel: 3.09 cm2
AV Peak grad: 5.3 mmHg
Ao pk vel: 1.15 m/s
S' Lateral: 4.6 cm
Single Plane A4C EF: 43.2 %

## 2022-01-21 MED ORDER — IOHEXOL 350 MG/ML SOLN
50.0000 mL | Freq: Once | INTRAVENOUS | Status: AC | PRN
  Administered 2022-01-21: 50 mL via INTRAVENOUS

## 2022-01-21 NOTE — Progress Notes (Signed)
Carotid artery duplex completed. Refer to "CV Proc" under chart review to view preliminary results.  01/21/2022 9:25 AM Eula Fried., MHA, RVT, RDCS, RDMS

## 2022-01-21 NOTE — Research (Addendum)
Section A:  Administrative Section  Subject ID: [id number] - __[id number]_ - 015 Subject Initials: _V_ _A_ _H_  Visit Interval:    []$   Screening/Baseline    []$  Activation   []$  0.5 Month       []$  1 Month     []$  2 Month     []$  3 Month       []$  6 Month     [x]$  12 Month         []$  Unscheduled, reason for visit: _________________________________________  Section B:  Physical Assessment   Date:    _25_/__JUL__/_2023______   (DD / MMM / YYYY)  Weight: __205_  []$  kg    [x]$  pounds Height (Screening Visit Only): ________  []$  cm []$  inches  Blood Pressure: _163__  / _89_mmHg Heart Rate: __71__ bpm  Section E:  Signature    Person completing form (Print Name): __Kimberly Eureka :) ____  Signature: [name] :) ______ Date: __08/10/2023_____________      Section A:  Administrative Section  Subject ID: _[id number]__ - _021_ - 015 Subject Initials: _V_ _A_ _H_  Visit Interval:    []$   Screening/Baseline    []$  Activation   []$  0.5 Month       []$  1 Month     []$  2 Month     []$  3 Month       []$  6 Month     [x]$  12 Month         []$  Unscheduled, reason for visit: _________________________________________  Section B:  Device Information   Battery Battery Voltage: 2.95 V   Battery Life: 22 Months   RRT Date: 12-Nov-2023 (DD/MMM/YYYY)  Lead Impeadance Right Lead 789 Ohms    []$  Low    []$  High   Left Lead _high___ Ohms    []$  Low    []$  High  Programmed Settings Pathway Pulse Width Amplitude Frequency   []$  Left 65 15 40   [x]$  Right     Section C:  Programming Information (12 Month and Unscheduled - not required)  Date: _25/_JUL_/_2023_     (DD / MMM / YYYY) Device information, therapy schedule and programmed settings can be found on the session summary report  Site Clinician Present: [name] :) ____________________  [employer] Employee helping with programming: Gregery Na _ []$  N/A  Location of [facility] person: [x]$  []$  Onsite Remote  Subject Experience   1. Did the subject experience transient  bradycardia or hypotension during device testing? []$  [x]$  Yes No   2. Did the subject experience transient electrical stimulation of non-vascular tissues? []$  [x]$  Yes No   3. If yes to either question above, was intervention beyond reprogramming needed or was it associated with an additional untoward event? []$  []$  Yes No   4a. Is there any indication that there has been unacceptable device interaction between the CVRx device and other implanted electrical stimulators or sensors device? []$  [x]$  []$  Yes No N/A (no other device)   4b. 4b. If "Yes" to question 4a, please describe the device interaction and corrective action taken:                  Section D:  Arrhythmia Interventions  Has the subject received a cardiac ablation since the last study visit? []$  Yes  Date of last procedure:   ____/____/_____ (DD/MMM/YYYY)   [x]$  No   []$  Unknown   If yes, what type and # of ablations Type []$  Atrial     []$   Ventricular    # of procedures: ____________   Was this pre-planned prior to CVRx implant? []$  Yes    []$  No (complete/update AE form)  Has the subject received a cardioversion since the last study visit? []$    Yes  Date of last procedure:   ____/____/_____ (DD/MMM/YYYY)   [x]$  No   []$  Unknown   If yes, what type and # of cardioversions? Type []$  Medications     []$  Electrical Shock    # of procedures: _____________   Was this pre-planned prior to CVRx implant? []$  Yes    []$  No (complete/update AE form)  Section E:  Adverse Events (N/A for Implant Interval)  Have any new adverse events or updates to existing events occurred since last visit? [x]$  Yes (complete/update AE form)   []$  No  Section F:  Medication Changes   Have there been any changes to the subject's home use medications for Arrhythmia, Antiplatelet/Anticoagulation and Heart failure medications since the last visit? [x]$  Yes (update Med form)   []$  No  Section G:  Were Fluoroscopy or X-ray images done? []$  Yes []$  Images sent to [facility]  (see Fluoroscopy/X-ray Worksheet)   [x]$  No  Section H:  Comments     N/A      Section I:  Signature   Person completing form (Print Name): __Kimberly Alba Destine :) _____________________  Signature: [name] :) ______ Date: _08/10/2023_________________________     SECTION A:  Administrative Section  Patient ID: [id number] - _021_ - 015 Patient Initials: _V_ _A_ _H_  Visit Interval:    []$   Screening/Baseline*    []$  Activation   []$  0.5 Month       []$  1 Month     []$  2 Month     []$  3 Month       []$  6 Month     [x]$  12 Month         []$  Unscheduled, reason for visit: _________________________________________  * For Screening / Baseline only the questions are based on 30 days prior to consent.  SECTION B:  COVID-19 Like Illness Symptoms   Has the subject experienced any cold, flu or COVID-19 symptoms since the last study visit? [x]$   No  (skip to section C)     []$  Yes, date of onset:  _____/_____ MMM/YYYY    If yes, check all symptoms that apply:   []$  Fevers or chills   []$   New or Worsening Cough  []$  Productive  []$  Dry     If yes to cough, indicate severity  []$  Constant  []$  Occasional, several per hour   []$  New or worsened shortness of breath   []$  Diarrhea   []$  Altered or reduced sense of smell or taste   []$  Muscle aches/Severe Fatigue   []$  Chest pain or tightness   []$  Sore throat   []$  Nausea or vomiting  SECTION C:  COVID-19 Like Illness Testing   Has the patient been tested for COVID-19 since the last study visit? [x]$  No     []$  Yes, date of test:  _____/_____ MMM/YYYY    If yes, test results:   []$  Positive []$  Negative []$  Unknown  Has the patient been tested for COVID-19 Antibodies since the last study visit? [x]$  No     []$  Yes, date of test:  _____/_____ MMM/YYYY    If yes, test results:   []$  Positive []$  Negative []$  Unknown  Has the patient been vaccinated for COVID-19 since the last  study visit? [x]$  No     []$  Yes, date of test:  _____/_____ MMM/YYYY  Has the  patient been tested for Influenza ("flu") since the last study visit? [x]$  No     []$  Yes, date of test:  _____/_____ MMM/YYYY    If yes, test results:   []$  Positive []$  Negative []$  Unknown  Has the patient been vaccinated for Influenza ("flu") since the last study visit? [x]$  No     []$  Yes, date of test:  _____/_____ MMM/YYYY  SECTION D:  COVID-19 Like Illness Exposure  Has the subject been told that they might have had COVID-19/have symptoms suggestive of COVID-19 since the last study visit? [x]$    No   []$  Yes   []$   Unknown  Has the subject been exposed to anyone with known or suspected COVID-19 since the last study visit? [x]$    No   []$  Yes   []$   Unknown  Has the subject been told that they might have had the "flu" or influenza since the last study visit? [x]$    No   []$  Yes   []$   Unknown  SECTION E:  Effects of COVID Pandemic on [place] Interactions  Since the last study visit, did the subject feel the need to go to an emergency department or hospital for their heart failure but decided not to because of concerns about COVID-19? [x]$   No   []$  Yes   []$  Unknown    If yes, how did they seek care (check all that apply):   []$  Telemedicine visit []$  In-person []$  Clinic []$  Urgent Care   []$  Subject did not change hospital/ER use due to COVID-19 []$  Other, specify: ________________________  Since the last study visit, did the subject have a cardiology/HF related appointment cancelled/rescheduled due to COVID-19 pandemic? [x]$   No   []$  Yes, how many? ___________   []$  Unknown  Since the last study visit, did the subject have any cardiology/HF related telemedicine visit due to COVID-19 pandemic? [x]$   No   []$  Yes, how many? ___________   []$  Unknown  Since the last study visit, did the subject have a cardiology/HF procedure cancelled/rescheduled due to COVID-19 pandemic? [x]$   No   []$  Yes, how many? ___________   []$  Unknown  SECTION F:  Effects of COVID Pandemic on Subject's Medications   Since the last visit, did any of their heart failure medications change or stop, even for a short time?   If yes, enter change into medication eCRF [x]$   No   []$  Yes, how many? ___________   []$  Unknown    If yes, why:   []$  Instructed by Doctor []$  Self-discontinued []$  Unknown []$  Other: ________________  Since the last visit, was the subject prescribed any medications for COVID-19? [x]$   No   []$  Yes   []$  Unknown    If yes, what medications (generic name):  SECTION G:  Effects of COVID Pandemic on Subject's Lifestyle  How has the subject's activity/exercise level changed due to COVID-19 pandemic? [x]$   No change   []$  More activity/exercise   []$  Less activity/exercise  How has the subject's smoking habits changed due to COVID-19? [x]$   Does not smoke   []$  No change   []$  Smoke more   []$  Smoke less  How has the subject's alcohol drinking habits changed due to COVID-19? [x]$   Does not drink   []$  No change   []$  Drink more   []$  Drink less  Section H:  Signature  Person completing form (Print Name): __Kimberly Alba Destine :) _______________________  Signature: [name] :) __ Date: _08/10/2023_________________________       Section A:  Administrative Section   Subject ID:  [id number] - _021_ - 015  Subject Initials:  _V_ _A_ _H__  Visit Interval:   (*if required)   []$  3 Month       []$  6 Month       [x]$  12 Month       []$  Unscheduled*   Section B:  CTA   If the subject had a CTA at baseline, a CTA at 72-monthshould be performed   Was a CTA performed?  Yes [x]$  Not Done   []$   CTA Date: __25_/_JUL_/_2023__                           (DD / MMM / YDallas Breeding  Images Sent to [facility]    [x]$   Stenosis  Internal Carotid (ICA)  Distal Common Carotid ([facility]   Right Side    ___0____%    __0_____%   Left Side    ___0____%    ____0___%   Name of person reading CTA:   Section C:  CDU   Was a CDU performed?  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  [x]$  No  []$  Yes (Specify side):       []$  Left   []$  Right  []$  Both     If yes is there a greater than 50% stenosis in either artery?  [x]$  No []$  Yes (Specify side**):        []$  Left   []$  Right  []$  Both   ** CTA should be completed if ?50% stenosis on the implanted side only at 14-monthisit      Section E:  1251-monthllow-up Only    Has the subject had new stenosis >40% since baseline in the artery where the lead was implanted?  [x]$  No      []$  Yes#, please indicate % new stenosis: _______%  (comment below)   Comments on determination of >40% stenosis:   # Subject must be followed after the 12-41-monthit, please see section 3.4.27 Final Study Visit at 12 Months   Section :  Comments    n/a        Section G:  Signature      Person completing form (Print Name): __KiPhilemon Kingdom_     Signature: _KimPhilemon Kingdom_    Date: _08/10/2023_________________________     Section A:  Administrative Section  Subject ID: _[id number]_ - 015 Subject Initials: _V_ _A_ _H_ Visit Interval:  []$   Baseline     []$  6 Month     [x]$  12 Month  Section B:  Cardiac Echo   Cardiac Echo Date:  _25_/_JUL_/_2023______ (DD / MMM / [place] Breeding)  [x]$  Images sent to [facility]  Heart rate _71_ bpm Echo type: [x]$  2D Images Captured: (4-Chamber and Biplane required) [x]$  PS-LAX  Blood Pressure _194_/_111_ mmHg  []$  3D  [x]$  Biplane        [x]$  Apical 4-Chamber        []$  Other, specify:_______   LV End Diastolic Dimension 123456 mm   LV End Systolic Dimension Q000111Q mm   LA Volume Q000111Q   LV End-Diastolic Volume 0000000 mL   LV End-Systolic Volume Q000111Q mL   LV Ejection Fraction __25____ %   Section C:  Comments  N/a          Section C:  Signature   Person completing form (Print Name): __Kimberly Lyons :) ______________________  Signature: [name] :) _____ Date: _08/10/2023___________________    Current Outpatient Medications:    allopurinol (ZYLOPRIM) 100 MG tablet, Take 200 mg by mouth daily., Disp: , Rfl:    amLODipine (NORVASC) 10 MG tablet, Take 10 mg by mouth daily., Disp: , Rfl:    aspirin 81 MG EC tablet, Take 81 mg by mouth daily as needed ("for heart")., Disp: , Rfl:    calcitRIOL (ROCALTROL) 0.5 MCG capsule, TAKE ONE (1) CAPSULE BY MOUTH DAILY, Disp: , Rfl:    carvedilol (COREG) 6.25 MG tablet, Take 1 tablet by mouth 2 (two) times daily., Disp: , Rfl:    Multiple Vitamin (MULTIVITAMIN PO), Take 1 tablet by mouth daily., Disp: , Rfl:    sacubitril-valsartan (ENTRESTO) 49-51 MG, Take 49-51 tablets by mouth 2 (two) times daily., Disp: , Rfl:    Semaglutide, 1 MG/DOSE, (OZEMPIC, 1 MG/DOSE,) 4 MG/3ML SOPN, INJECT 1 MG SUBCUTANEOUSLY ONCE A WEEK, Disp: , Rfl:    torsemide (DEMADEX) 20 MG tablet, Take 40 mg by mouth as needed (as needed per pt)., Disp: , Rfl:    insulin detemir (LEVEMIR FLEXTOUCH) 100 UNIT/ML FlexPen, Inject 18 Units into the skin 1 day or 1 dose. (Patient not taking: Reported on 01/22/2022), Disp: , Rfl:    sildenafil (VIAGRA) 100 MG tablet, Take 100 mg by mouth as needed. For Sexual activity (Patient not taking: Reported on 01/22/2022), Disp: , Rfl:

## 2022-01-23 NOTE — Progress Notes (Signed)
Batwire 1 yr  Will send results to patient pcp and cardiologist

## 2022-01-23 NOTE — Progress Notes (Signed)
Batwire 1 yr echo  Patient aware of results will send to pcp and cardiologist

## 2022-01-23 NOTE — Progress Notes (Signed)
Batwire 1 year  Will send results to pcp and cardiologist

## 2022-02-13 NOTE — Research (Addendum)
Section A.   Administrative Section  Patient ID: _[id number]_ - _021_ - 015 Patient Initials: _V_ _A_ _H_  Exit / Termination Date: _25_/_JUL_/_2023___                                            (DD / MMM / Annamarie Major)  Section B.   Reason for Exit / Termination  []  Screening/baseline failure  []  Failed Implant Attempt(s)  [x]  Completed all Study Required Visits  []  PI Withdrew  []  Subject withdrew consent (add comment below)  []  Subject refuses further follow-up testing (add comment below)  []  Lost to Follow-up  []  System Explanted (Complete Additional Procedure Worksheet)  []  Deceased (Complete Subject Death Worksheet)  []  Other, please specify: ________________________  Comments  N /a        Section C. Signature    Person completing form (Print Name): :) __________  Signature: :) _ Date: _08/17/2023_____________________

## 2022-08-05 NOTE — Progress Notes (Deleted)
Electrophysiology Office Note Date: 08/05/2022  ID:  Phillip Ruiz, DOB 1970/11/30, MRN [id number]  PCP: No primary care provider on file. Primary Cardiologist: Thompson Grayer, MD (Inactive) Electrophysiologist: None   CC: Barostim follow up  Phillip Ruiz is a 51 y.o. male seen today for barostim follow up.  Pt was enrolled in [facility] and completed the study protocol 12/2021. Now will be followed q 6 months.    he is doing well since implantation. At last visit, pt device titrated from *** to *** by 0.4 ma intervals with good BP/HR response and no stimulation  The patients goals for barostim titration are ***   Device History: Barostim (batwire) implanted 12/2020 for Chronic systolic CHF ICD ***?   Past Medical History:  Diagnosis Date   Acne    Nec   Anxiety    Cardiomyopathy ([place])    Chronic combined systolic and diastolic congestive heart failure, NYHA class 3 (HCC)    CKD (chronic kidney disease), stage III (HCC)    DM type 2 (diabetes mellitus, type 2) (HCC)    HFrEF (heart failure with reduced ejection fraction) (HCC)    History of gout    Hypertension, essential, benign    ICD (implantable cardioverter-defibrillator) in place    Insomnia    Nec   LVH (left ventricular hypertrophy)    Poor compliance    SOB (shortness of breath) on exertion    Uncontrolled hypertension     Current Outpatient Medications  Medication Instructions   allopurinol (ZYLOPRIM) 200 mg, Oral, Daily   amLODipine (NORVASC) 10 mg, Oral, Daily   aspirin EC 81 mg, Oral, Daily PRN   calcitRIOL (ROCALTROL) 0.5 MCG capsule TAKE ONE (1) CAPSULE BY MOUTH DAILY   carvedilol (COREG) 6.25 MG tablet 1 tablet, Oral, 2 times daily   Levemir FlexTouch 18 Units, 1 Day/Dose   Multiple Vitamin (MULTIVITAMIN PO) 1 tablet, Oral, Daily   sacubitril-valsartan (ENTRESTO) 49-51 MG 49-51 tablets, Oral, 2 times daily   Semaglutide, 1 MG/DOSE, (OZEMPIC, 1 MG/DOSE,) 4 MG/3ML SOPN INJECT 1 MG  SUBCUTANEOUSLY ONCE A WEEK   sildenafil (VIAGRA) 100 mg, As needed   torsemide (DEMADEX) 40 mg, Oral, As needed    Family History: Family History  Problem Relation Age of Onset   Stroke Mother    Hypertension Mother    Stroke Father    Hypertension Father    Heart disease Maternal Grandfather    Diabetes Paternal Grandmother    Heart disease Paternal Grandmother    Diabetes Paternal Grandfather    Stroke Paternal Grandfather     Physical Exam: There were no vitals filed for this visit.   GEN- The patient is well appearing, alert and oriented x 3 today.   HEENT: Normocephalic, atraumatic. Lungs- Clear to ausculation bilaterally, normal work of breathing.  Heart- Regular rate and rhythm, no murmurs, rubs or gallops  GI- soft, non-tender, non-distended Extremities- no clubbing, cyanosis. {EDEMA LEVEL:28147::"No"} peripheral edema MS- no significant deformity or atrophy Skin- warm and dry, no rash or lesion; ICD pocket well healed Psych- euthymic mood, full affect Neuro- strength and sensation are intact  Barostim Interrogation- Performed personally and reviewed in detail today,  See scanned report  EKG:  EKG {ACTION; IS/IS IFO:[id number]} ordered today.  Other studies Reviewed: Additional studies/ records that were reviewed today include: ***  Assessment and Plan:  1. Chronic systolic CHF s/p Barostim implantation (Batwire) Echo 12/2021 LVEF 20-25% NYHA *** symptoms. ***  Device titrated from *** millamp  to *** milliamp by increments of 0.4 with good blood pressure response and no stimulation.  Device impedence stable. Pt goals are to ***  Normal device function See scanned report. Will follow up in *** weeks to continue titration with goal of 6-8 milliamps for chronic settings.    Current medicines are reviewed at length with the patient today.   The patient {ACTIONS; HAS/DOES NOT HAVE:19233} concerns regarding his medicines.  The following changes were made today:   none  Labs/ tests ordered today include:  No orders of the defined types were placed in this encounter.   Disposition:   Follow up with {Blank single:19197::"Dr. Allred","Dr. Arlan Organ. Klein","Dr. Camnitz","EP APP"} in 4 {Blank single:19197::"Months","Weeks"} for further barostim titration.   Jacalyn Lefevre, PA-C  08/05/2022 12:41 PM  [place] [address] [phone] (office) [phone] (fax)

## 2022-08-07 ENCOUNTER — Ambulatory Visit: Payer: Medicare HMO | Attending: Student | Admitting: Student

## 2022-08-07 DIAGNOSIS — I5022 Chronic systolic (congestive) heart failure: Secondary | ICD-10-CM

## 2022-08-07 DIAGNOSIS — Z006 Encounter for examination for normal comparison and control in clinical research program: Secondary | ICD-10-CM

## 2022-09-01 NOTE — Progress Notes (Deleted)
Electrophysiology Office Note Date: 09/01/2022  ID:  RIDDIK THIERRY, DOB 1971/01/12, MRN [id number]  PCP: No primary care provider on file. Primary Cardiologist: Thompson Grayer, MD (Inactive) Electrophysiologist: None   CC: Barostim follow up  Phillip Ruiz is a 52 y.o. male seen today for barostim follow up.  Pt was enrolled in [facility] and completed the study protocol 12/2021. Now will be followed q 6 months.    he is doing well since implantation. At last visit, pt device titrated from *** to *** by 0.4 ma intervals with good BP/HR response and no stimulation  The patients goals for barostim titration are ***   Device History: Barostim (batwire) implanted 12/2020 for Chronic systolic CHF ICD ***?   Past Medical History:  Diagnosis Date   Acne    Nec   Anxiety    Cardiomyopathy ([place])    Chronic combined systolic and diastolic congestive heart failure, NYHA class 3 (HCC)    CKD (chronic kidney disease), stage III (HCC)    DM type 2 (diabetes mellitus, type 2) (HCC)    HFrEF (heart failure with reduced ejection fraction) (HCC)    History of gout    Hypertension, essential, benign    ICD (implantable cardioverter-defibrillator) in place    Insomnia    Nec   LVH (left ventricular hypertrophy)    Poor compliance    SOB (shortness of breath) on exertion    Uncontrolled hypertension     Current Outpatient Medications  Medication Instructions   allopurinol (ZYLOPRIM) 200 mg, Oral, Daily   amLODipine (NORVASC) 10 mg, Oral, Daily   aspirin EC 81 mg, Oral, Daily PRN   calcitRIOL (ROCALTROL) 0.5 MCG capsule TAKE ONE (1) CAPSULE BY MOUTH DAILY   carvedilol (COREG) 6.25 MG tablet 1 tablet, Oral, 2 times daily   Levemir FlexTouch 18 Units, 1 Day/Dose   Multiple Vitamin (MULTIVITAMIN PO) 1 tablet, Oral, Daily   sacubitril-valsartan (ENTRESTO) 49-51 MG 49-51 tablets, Oral, 2 times daily   Semaglutide, 1 MG/DOSE, (OZEMPIC, 1 MG/DOSE,) 4 MG/3ML SOPN INJECT 1 MG  SUBCUTANEOUSLY ONCE A WEEK   sildenafil (VIAGRA) 100 mg, As needed   torsemide (DEMADEX) 40 mg, Oral, As needed    Family History: Family History  Problem Relation Age of Onset   Stroke Mother    Hypertension Mother    Stroke Father    Hypertension Father    Heart disease Maternal Grandfather    Diabetes Paternal Grandmother    Heart disease Paternal Grandmother    Diabetes Paternal Grandfather    Stroke Paternal Grandfather     Physical Exam: There were no vitals filed for this visit.   GEN- The patient is well appearing, alert and oriented x 3 today.   HEENT: Normocephalic, atraumatic. Lungs- Clear to ausculation bilaterally, normal work of breathing.  Heart- Regular rate and rhythm, no murmurs, rubs or gallops  GI- soft, non-tender, non-distended Extremities- no clubbing, cyanosis. {EDEMA LEVEL:28147::"No"} peripheral edema MS- no significant deformity or atrophy Skin- warm and dry, no rash or lesion; ICD pocket well healed Psych- euthymic mood, full affect Neuro- strength and sensation are intact  Barostim Interrogation- Performed personally and reviewed in detail today,  See scanned report  EKG:  EKG {ACTION; IS/IS VG:[id number] ordered today.  Other studies Reviewed: Additional studies/ records that were reviewed today include: ***  Assessment and Plan:  1. Chronic systolic CHF s/p Barostim implantation (Batwire) Echo 12/2021 LVEF 20-25% NYHA *** symptoms. ***  Device titrated from *** millamp  to *** milliamp by increments of 0.4 with good blood pressure response and no stimulation.  Device impedence stable. Pt goals are to ***  Normal device function See scanned report. Will follow up in *** weeks to continue titration with goal of 6-8 milliamps for chronic settings.    Current medicines are reviewed at length with the patient today.   The patient {ACTIONS; HAS/DOES NOT HAVE:19233} concerns regarding his medicines.  The following changes were made today:   none  Labs/ tests ordered today include:  No orders of the defined types were placed in this encounter.   Disposition:   Follow up with {Blank single:19197::"Dr. Allred","Dr. Arlan Organ. Klein","Dr. Camnitz","EP APP"} in 4 {Blank single:19197::"Months","Weeks"} for further barostim titration.   Jacalyn Lefevre, PA-C  09/01/2022 3:04 PM  [place] [address] [phone] (office) [phone] (fax)

## 2022-09-02 ENCOUNTER — Ambulatory Visit: Payer: Medicare HMO | Admitting: Student

## 2022-09-02 DIAGNOSIS — I5022 Chronic systolic (congestive) heart failure: Secondary | ICD-10-CM

## 2022-09-02 NOTE — Progress Notes (Deleted)
Electrophysiology Office Note Date: 09/02/2022  ID:  Phillip Ruiz, DOB 10-12-1970, MRN [id number]  PCP: No primary care provider on file. Primary Cardiologist: Thompson Grayer, MD (Inactive) Electrophysiologist: None   CC: Barostim follow up  Phillip Ruiz is a 51 y.o. male seen today for barostim follow up.  Pt was enrolled in [facility] and completed the study protocol 12/2021. Now will be followed q 6 months.    he is doing well since implantation. At last visit, pt device titrated from *** to *** by 0.4 ma intervals with good BP/HR response and no stimulation  The patients goals for barostim titration are ***   Device History: Barostim (batwire) implanted 12/2020 for Chronic systolic CHF ICD ***?   Past Medical History:  Diagnosis Date   Acne    Nec   Anxiety    Cardiomyopathy ([place])    Chronic combined systolic and diastolic congestive heart failure, NYHA class 3 (HCC)    CKD (chronic kidney disease), stage III (HCC)    DM type 2 (diabetes mellitus, type 2) (HCC)    HFrEF (heart failure with reduced ejection fraction) (HCC)    History of gout    Hypertension, essential, benign    ICD (implantable cardioverter-defibrillator) in place    Insomnia    Nec   LVH (left ventricular hypertrophy)    Poor compliance    SOB (shortness of breath) on exertion    Uncontrolled hypertension     Current Outpatient Medications  Medication Instructions   allopurinol (ZYLOPRIM) 200 mg, Oral, Daily   amLODipine (NORVASC) 10 mg, Oral, Daily   aspirin EC 81 mg, Oral, Daily PRN   calcitRIOL (ROCALTROL) 0.5 MCG capsule TAKE ONE (1) CAPSULE BY MOUTH DAILY   carvedilol (COREG) 6.25 MG tablet 1 tablet, Oral, 2 times daily   Levemir FlexTouch 18 Units, 1 Day/Dose   Multiple Vitamin (MULTIVITAMIN PO) 1 tablet, Oral, Daily   sacubitril-valsartan (ENTRESTO) 49-51 MG 49-51 tablets, Oral, 2 times daily   Semaglutide, 1 MG/DOSE, (OZEMPIC, 1 MG/DOSE,) 4 MG/3ML SOPN INJECT 1 MG  SUBCUTANEOUSLY ONCE A WEEK   sildenafil (VIAGRA) 100 mg, As needed   torsemide (DEMADEX) 40 mg, Oral, As needed    Family History: Family History  Problem Relation Age of Onset   Stroke Mother    Hypertension Mother    Stroke Father    Hypertension Father    Heart disease Maternal Grandfather    Diabetes Paternal Grandmother    Heart disease Paternal Grandmother    Diabetes Paternal Grandfather    Stroke Paternal Grandfather     Physical Exam: There were no vitals filed for this visit.   GEN- The patient is well appearing, alert and oriented x 3 today.   HEENT: Normocephalic, atraumatic. Lungs- Clear to ausculation bilaterally, normal work of breathing.  Heart- Regular rate and rhythm, no murmurs, rubs or gallops  GI- soft, non-tender, non-distended Extremities- no clubbing, cyanosis. {EDEMA LEVEL:28147::"No"} peripheral edema MS- no significant deformity or atrophy Skin- warm and dry, no rash or lesion; ICD pocket well healed Psych- euthymic mood, full affect Neuro- strength and sensation are intact  Barostim Interrogation- Performed personally and reviewed in detail today,  See scanned report  EKG:  EKG {ACTION; IS/IS GI:[id number] ordered today.  Other studies Reviewed: Additional studies/ records that were reviewed today include: ***  Assessment and Plan:  1. Chronic systolic CHF s/p Barostim implantation (Batwire) Echo 12/2021 LVEF 20-25% NYHA *** symptoms. ***  Device titrated from *** millamp  to *** milliamp by increments of 0.4 with good blood pressure response and no stimulation.  Device impedence stable. Pt goals are to ***  Normal device function See scanned report. Will follow up in *** weeks to continue titration with goal of 6-8 milliamps for chronic settings.    Current medicines are reviewed at length with the patient today.   The patient {ACTIONS; HAS/DOES NOT HAVE:19233} concerns regarding his medicines.  The following changes were made today:   none  Labs/ tests ordered today include:  No orders of the defined types were placed in this encounter.   Disposition:   Follow up with {Blank single:19197::"Dr. Allred","Dr. Arlan Organ. Klein","Dr. Camnitz","EP APP"} in 4 {Blank single:19197::"Months","Weeks"} for further barostim titration.   Jacalyn Lefevre, PA-C  09/02/2022 11:21 AM  [facility] [address] [place] [address] [phone] (office) [phone] (fax)

## 2022-09-05 ENCOUNTER — Encounter: Payer: Medicare HMO | Admitting: Student

## 2022-12-22 NOTE — Progress Notes (Deleted)
  Cardiology Office Note:   Date:  12/22/2022  ID:  Phillip Ruiz, DOB December 07, 1970, MRN [id number]  History of Present Illness:   Phillip Ruiz is a 52 y.o. male with h/o chronic systolic CHF, CKDIII, and HTNseen today for routine electrophysiology followup.   Pt has previously implanted BSx DDD ICD followed ***  Since last being seen in our clinic the patient reports doing ***.  he denies chest pain, palpitations, dyspnea, PND, orthopnea, nausea, vomiting, dizziness, syncope, edema, weight gain, or early satiety.   Device History: Barostim (batwire) implanted 12/2020 for Chronic systolic CHF  [employer] ICD implanted 05/10/19 for Chronic systolic CHF  Review of systems complete and found to be negative unless listed in HPI.    Studies Reviewed:    EKG is ordered today. Personal review as below.  ***  Barostim Interrogation- Performed personally and reviewed in detail today,  See scanned report  Risk Assessment/Calculations:   {Does this patient have ATRIAL FIBRILLATION?:[id number]} No BP recorded.  {Refresh Note OR Click here to enter BP  :1}***        Physical Exam:   VS:  There were no vitals taken for this visit.   Wt Readings from Last 3 Encounters:  01/21/22 205 lb (93 kg)  08/09/21 204 lb (92.5 kg)  07/17/21 205 lb (93 kg)     GEN: Well nourished, well developed in no acute distress NECK: No JVD; No carotid bruits CARDIAC: {EPRHYTHM:28826}, no murmurs, rubs, gallops RESPIRATORY:  Clear to auscultation without rales, wheezing or rhonchi  ABDOMEN: Soft, non-tender, non-distended EXTREMITIES:  No edema; No deformity   ASSESSMENT AND PLAN:    1. Chronic systolic CHF s/p Barostim implantation 12/2020 (BATWIRE) S/p BSx DDD ICD 2020 NYHA *** symptoms. ***  Chronic device programming from *** millamp to *** milliamp by increments of 0.4 with good blood pressure response and no stimulation.  Device impedence stable. Pt goals are to ***  Normal  device function See scanned report. Will follow up in *** weeks to continue titration with goal of 6-8 milliamps for chronic settings.      {Are you ordering a CV Procedure (e.g. stress test, cath, DCCV, TEE, etc)?   Press F2        :[id number]}   Disposition:   Follow up with {EPPROVIDERS:28135} in {EPFOLLOW UP:28173}  Signed, Graciella Freer, PA-C

## 2022-12-23 ENCOUNTER — Encounter: Payer: Self-pay | Admitting: Student

## 2022-12-23 ENCOUNTER — Ambulatory Visit: Payer: Medicare HMO | Attending: Student | Admitting: Student

## 2022-12-23 DIAGNOSIS — I5022 Chronic systolic (congestive) heart failure: Secondary | ICD-10-CM

## 2023-01-03 IMAGING — CT CT ANGIO NECK
1 of 7 series · 7 of 33 positions shown · IV contrast (APPLIED)
Comparison: None.

CLINICAL DATA: Barostim device placement, planning

EXAM:
CT ANGIOGRAPHY NECK
TECHNIQUE: Multidetector CT imaging of the neck was performed using the
standard protocol during bolus administration of intravenous
contrast. Multiplanar CT image reconstructions and MIPs were
obtained to evaluate the vascular anatomy. Carotid stenosis
measurements (when applicable) are obtained utilizing NASCET
criteria, using the distal internal carotid diameter as the
denominator.
CONTRAST:  75mL OMNIPAQUE IOHEXOL 350 MG/ML SOLN

[Series 7: ax thin · axial · 0.39mm/px · z∈[-289,-42]mm · 7 of 332 slices shown]
[im 42/332  soft-tissue]
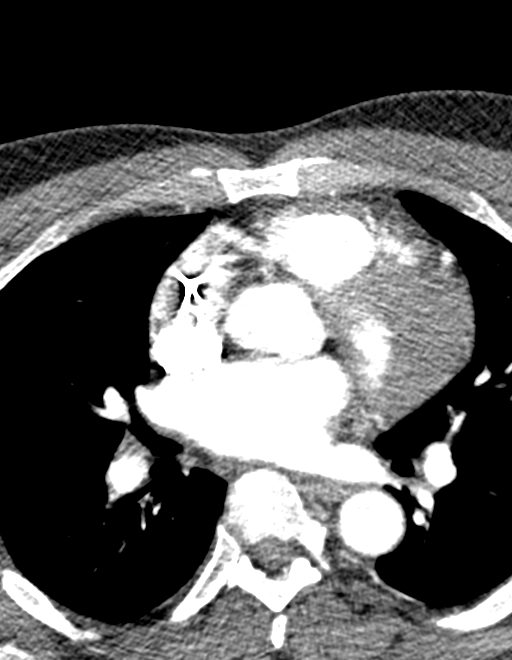
[im 83/332  bone]
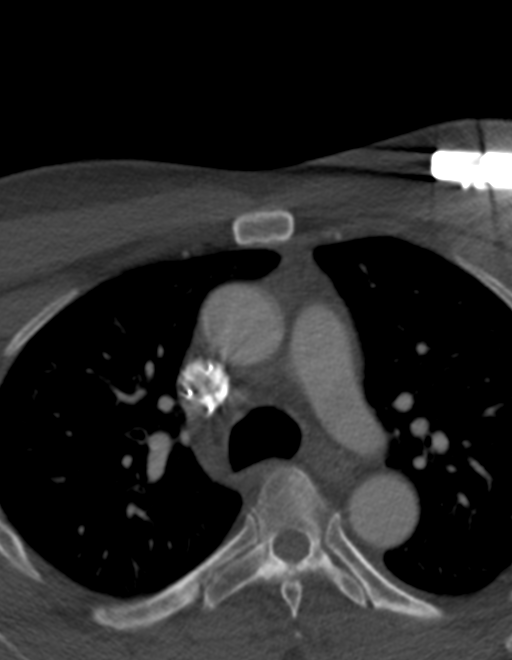
[im 125/332  soft-tissue]
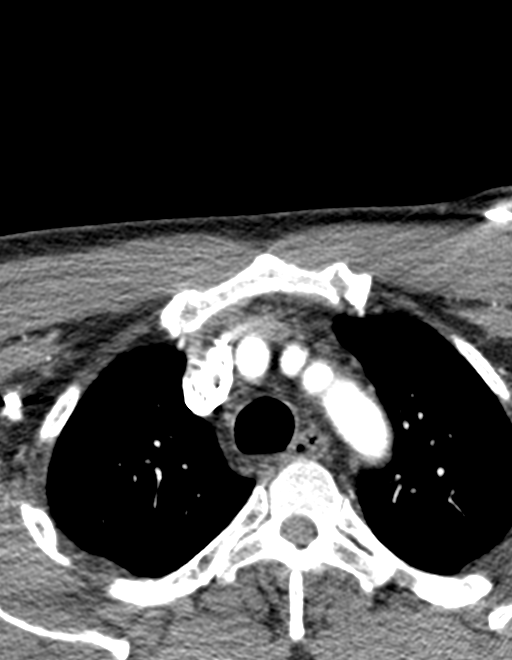
[im 166/332  bone]
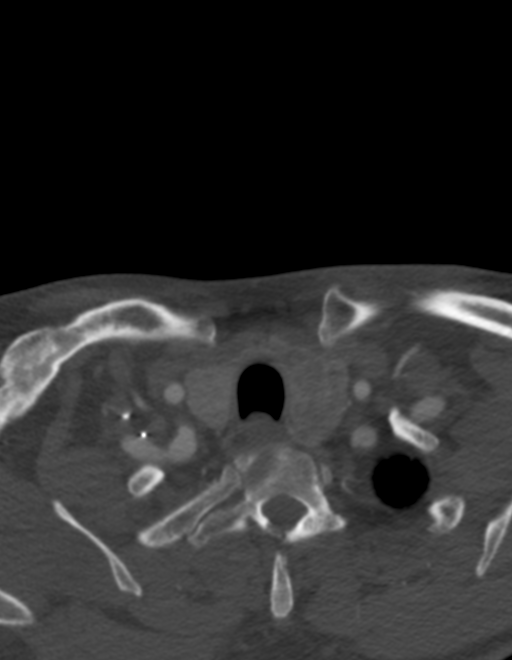
[im 207/332  soft-tissue]
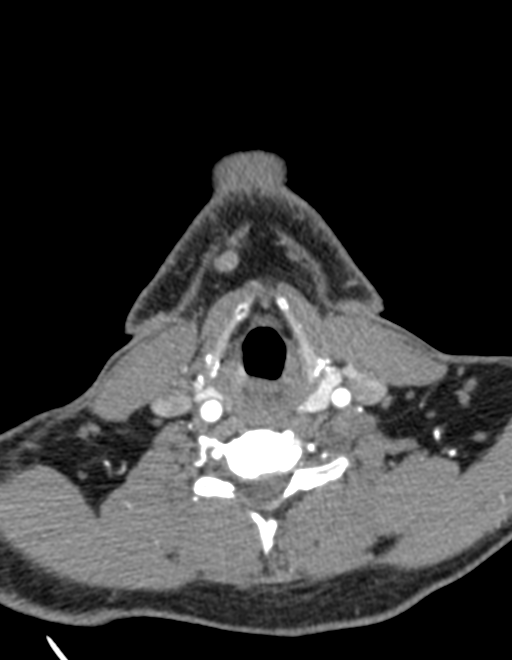
[im 249/332  bone]
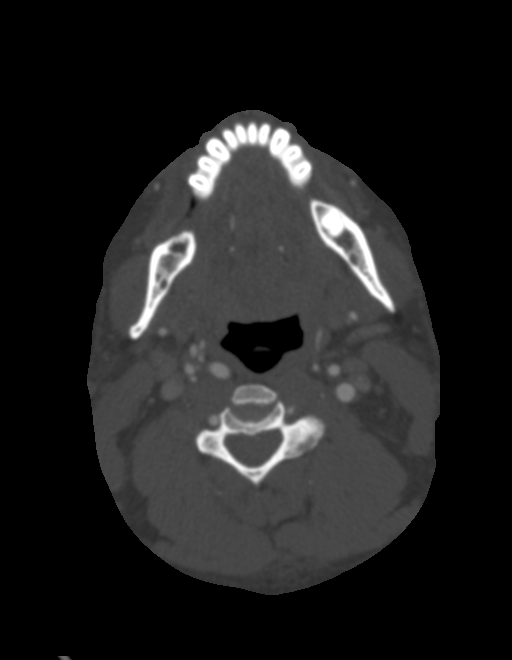
[im 290/332  soft-tissue]
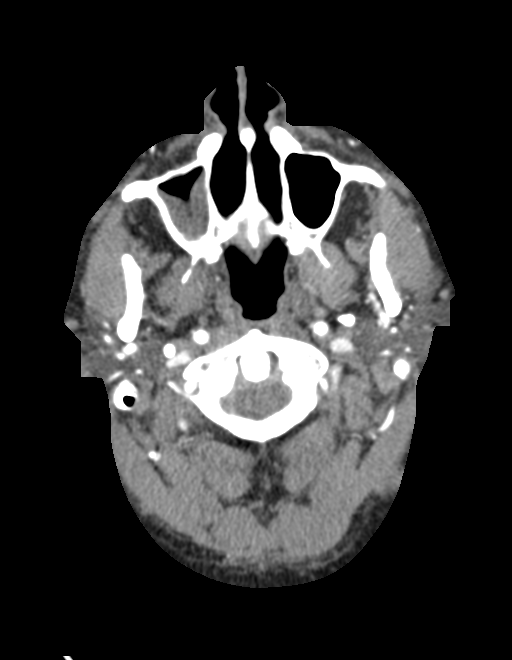

[7 of 33 positions shown; findings below may reference images not displayed]

FINDINGS: Aortic arch: Thoracic aorta is normal in caliber. Standard branching
with patent great vessel origins.

Right carotid system: Common carotid is patent. Internal and
external carotid arteries are patent. There is no plaque or stenosis
at the bifurcation or ICA origin. Bifurcation is below the level of
the angle of the mandible.

Left carotid system: Common carotid is patent. There is mild
eccentric noncalcified plaque at the ICA origin causing minimal
stenosis. Bifurcation is below the level of the angle of the
mandible.

Vertebral arteries: Patent.  Right vertebral artery is dominant.

Skeleton: Mild cervical spine degenerative changes.

Other neck: Heterogeneous, enlarged left thyroid.

Upper chest: Left chest wall ICD.  Included lung apices are clear.
IMPRESSION: Standard anatomy with patent carotid vessels. Mild eccentric
noncalcified plaque at the left ICA origin with minimal stenosis.

Heterogeneous, enlarged left thyroid. Nonemergent ultrasound is
recommended if not previously performed.

## 2023-02-20 NOTE — Research (Signed)
Late entry:   Batwire Informed Consent   Subject Name: Phillip Ruiz  Subject met inclusion and exclusion criteria.  The informed consent form, study requirements and expectations were reviewed with the subject and questions and concerns were addressed prior to the signing of the consent form.  The subject verbalized understanding of the trial requirements.  The subject agreed to participate in the [facility] trial and signed the informed consent on 07/17/2021.  The informed consent was obtained prior to performance of any protocol-specific procedures for the subject.  A copy of the signed informed consent was given to the subject and a copy was placed in the subject's medical record.     Subject re-consented to  Rev C Version Date: December 20, 2020 Consented by Evern Bio, RN    Documented in note by  Mercer Pod D

## 2023-07-17 NOTE — Progress Notes (Unsigned)
  Cardiology Office Note:   Date:  07/17/2023  ID:  Phillip Ruiz, DOB 03/08/1971, MRN [id number]  Primary Cardiologist: Hillis Range, MD (Inactive) Electrophysiologist: [facility] in [place]  History of Present Illness:   Phillip Ruiz is a 52 y.o. male with h/o Chronic systolic CHF, HFpEF, CKD III, DM2, and HTN seen today for routine electrophysiology followup and to discuss gen change of his Barostim device.   Since last being seen in our clinic the patient reports doing OK. Overall, he denies chest pain, palpitations, dyspnea, PND, orthopnea, nausea, vomiting, dizziness, syncope, edema, weight gain, or early satiety.   Review of systems complete and found to be negative unless listed in HPI.    EP Information / Studies Reviewed:    EKG is not ordered today.   Barostim Interrogation- Performed personally and reviewed in detail today,  See scanned report  Device History Field seismologist ICD implanted 05/10/19 for Chronic systolic CHF  Barostim (BatWire) implanted 12/2020 for Chronic systolic CHF  ICD/PPM not performed - ICD is managed by [facility] in [place]   Physical Exam:   VS:  There were no vitals taken for this visit.   Wt Readings from Last 3 Encounters:  01/21/22 205 lb (93 kg)  08/09/21 204 lb (92.5 kg)  07/17/21 205 lb (93 kg)     GEN: Well nourished, well developed in no acute distress NECK: No JVD; No carotid bruits CARDIAC: Regular rate and rhythm, no murmurs, rubs, gallops RESPIRATORY:  Clear to auscultation without rales, wheezing or rhonchi  ABDOMEN: Soft, non-tender, non-distended EXTREMITIES:  No edema; No deformity   ASSESSMENT AND PLAN:    Chronic systolic CHF s/p Environmental manager and Barostim implantation NYHA II-III symptoms, which he feels have improved significantly with BAT.   Device left at chronic programming.  Device impedence stable. Normal device function See scanned report. ETA need for battery replacement is  12/18/2023  HTN Stable on current regimen   Disposition:   Follow up with Me on a day that Dr. Elberta Fortis is in the office, or with Dr. Elberta Fortis in early may to establish and discuss BAT generator change.   Signed, Graciella Freer, PA-C

## 2023-07-20 ENCOUNTER — Encounter: Payer: Self-pay | Admitting: Student

## 2023-07-20 ENCOUNTER — Ambulatory Visit: Payer: Medicare HMO | Attending: Student | Admitting: Student

## 2023-07-20 VITALS — BP 164/100 | HR 92 | Ht 68.0 in | Wt 209.2 lb

## 2023-07-20 DIAGNOSIS — I10 Essential (primary) hypertension: Secondary | ICD-10-CM

## 2023-07-20 DIAGNOSIS — I5042 Chronic combined systolic (congestive) and diastolic (congestive) heart failure: Secondary | ICD-10-CM

## 2023-07-20 DIAGNOSIS — I5022 Chronic systolic (congestive) heart failure: Secondary | ICD-10-CM

## 2023-07-20 NOTE — Patient Instructions (Addendum)
Medication Instructions:  Your physician recommends that you continue on your current medications as directed. Please refer to the Current Medication list given to you today.  *If you need a refill on your cardiac medications before your next appointment, please call your pharmacy*  Lab Work: None ordered If you have labs (blood work) drawn today and your tests are completely normal, you will receive your results only by: MyChart Message (if you have MyChart) OR A paper copy in the mail If you have any lab test that is abnormal or we need to change your treatment, we will call you to review the results.  Follow-Up: At [facility], you and your health needs are our priority.  As part of our continuing mission to provide you with exceptional heart care, we have created designated Provider Care Teams.  These Care Teams include your primary Cardiologist (physician) and Advanced Practice Providers (APPs -  Physician Assistants and Nurse Practitioners) who all work together to provide you with the care you need, when you need it.  We recommend signing up for the patient portal called "MyChart".  Sign up information is provided on this After Visit Summary.  MyChart is used to connect with patients for Virtual Visits (Telemedicine).  Patients are able to view lab/test results, encounter notes, upcoming appointments, etc.  Non-urgent messages can be sent to your provider as well.   To learn more about what you can do with MyChart, go to [email or web address].    Your next appointment:   Early to mid May 2025  Provider:   Loman Brooklyn, MD or  [facility]" [place], [place] on a day that Dr Elberta Fortis is in the office.

## 2023-11-09 ENCOUNTER — Ambulatory Visit: Payer: Medicare HMO | Admitting: Cardiology

## 2023-11-09 NOTE — Progress Notes (Deleted)
  Electrophysiology Office Note:   Date:  11/09/2023  ID:  Phillip Ruiz, DOB 19-Jun-1971, MRN [id number]  Primary Cardiologist: Jolly Needle, MD (Inactive) Primary Heart Failure: None Electrophysiologist: None  {Click to update primary MD,subspecialty MD or APP then REFRESH:1}    History of Present Illness:   Phillip Ruiz is a 54 y.o. male with h/o chronic systolic heart failure, CKD stage III, diabetes, hypertension seen today for routine electrophysiology followup.   Since last being seen in our clinic the patient reports doing ***.  he denies chest pain, palpitations, dyspnea, PND, orthopnea, nausea, vomiting, dizziness, syncope, edema, weight gain, or early satiety.   Review of systems complete and found to be negative unless listed in HPI.      EP Information / Studies Reviewed:    {EKGtoday:28818}      ICD Interrogation-  reviewed in detail today,  See PACEART report.  Device History: Field seismologist ICD implanted 05/10/2019 for chronic systolic heart failure History of appropriate therapy: No History of AAD therapy: No   Risk Assessment/Calculations:             Physical Exam:   VS:  There were no vitals taken for this visit.   Wt Readings from Last 3 Encounters:  07/20/23 209 lb 3.2 oz (94.9 kg)  01/21/22 205 lb (93 kg)  08/09/21 204 lb (92.5 kg)     GEN: Well nourished, well developed in no acute distress NECK: No JVD; No carotid bruits CARDIAC: {EPRHYTHM:28826}, no murmurs, rubs, gallops RESPIRATORY:  Clear to auscultation without rales, wheezing or rhonchi  ABDOMEN: Soft, non-tender, non-distended EXTREMITIES:  No edema; No deformity   ASSESSMENT AND PLAN:    Chronic systolic dysfunction s/p Environmental manager dual chamber ICD   euvolemic today Stable on an appropriate medical regimen Normal ICD function Sensing, threshold, impedance within normal limits Programming reviewed and appropriate See Pace Art report No changes  today  2.  Hypertension:***  Disposition:   Follow up with {EPPROVIDERS:28135} {EPFOLLOW QM:57846}   Signed, Shaquela Weichert Cortland Ding, MD

## 2023-12-02 ENCOUNTER — Encounter: Payer: Self-pay | Admitting: Cardiology

## 2023-12-02 ENCOUNTER — Ambulatory Visit: Attending: Cardiology | Admitting: Cardiology

## 2023-12-02 VITALS — BP 140/100 | HR 89 | Ht 68.0 in | Wt 209.0 lb

## 2023-12-02 DIAGNOSIS — I5042 Chronic combined systolic (congestive) and diastolic (congestive) heart failure: Secondary | ICD-10-CM | POA: Diagnosis not present

## 2023-12-02 DIAGNOSIS — I429 Cardiomyopathy, unspecified: Secondary | ICD-10-CM

## 2023-12-02 DIAGNOSIS — I517 Cardiomegaly: Secondary | ICD-10-CM

## 2023-12-02 NOTE — Patient Instructions (Addendum)
 Medication Instructions:  Your physician recommends that you continue on your current medications as directed. Please refer to the Current Medication list given to you today.  *If you need a refill on your cardiac medications before your next appointment, please call your pharmacy*  Lab Work: None ordered.  You may go to any [facility] Location for your lab work:  [facility] - [address] ([facility]) - [address] [phone] [address]  [place] - [address]   [place]  - [address]   [place] - [address] - [address] [employer]  - [address] - [address] ([facility]   If you have labs (blood work) drawn today and your tests are completely normal, you will receive your results only by: [employer] (if you have MyChart)  If you have any lab test that is abnormal or we need to change your treatment, we will call you or send a MyChart message to review the results.  Testing/Procedures: None ordered.  Follow-Up: At [facility], you and your health needs are our priority.  As part of our continuing mission to provide you with exceptional heart care, we have created designated Provider Care Teams.  These Care Teams include your primary Cardiologist (physician) and Advanced Practice Providers (APPs -  Physician Assistants and Nurse Practitioners) who all work together to provide you with the care you need, when you need it.  Your next appointment:   We will look into your insurance coverage for you  The format for your next appointment:   In Person  Provider:   Agatha Horsfall, MD{or one of the following Advanced Practice Providers on your designated Care Team:   Mertha Abrahams, PA-C Bambi Lever "Jonelle Neri" [place], [place] Neda Balk, NP  Note: Remote monitoring is used to monitor your Pacemaker/ ICD from home. This monitoring reduces the number  of office visits required to check your device to one time per year. It allows us  to keep an eye on the functioning of your device to ensure it is working properly.

## 2023-12-02 NOTE — Progress Notes (Signed)
  Electrophysiology Office Note:   Date:  12/02/2023  ID:  Phillip Ruiz, DOB 28-Nov-1970, MRN [id number]  Primary Cardiologist: Phillip Needle, MD (Inactive) Primary Heart Failure: None Electrophysiologist: Phillip Hardwick Cortland Ding, MD      History of Present Illness:   Phillip Ruiz is a 53 y.o. male with h/o chronic systolic heart failure, CKD 3, diabetes, hypertension seen today for routine electrophysiology followup.   Since last being seen in our clinic the patient reports doing well.  He has no acute complaints at this time.  He has not had any acute heart failure symptoms.  He has no chest pain, fatigue, shortness of breath.  He is able to do his daily activities.  He is having issues with his left eye and has follow-up with ophthalmology.  he denies chest pain, palpitations, dyspnea, PND, orthopnea, nausea, vomiting, dizziness, syncope, edema, weight gain, or early satiety.   Review of systems complete and found to be negative unless listed in HPI.      EP Information / Studies Reviewed:    EKG is ordered today. Personal review as below.  EKG Interpretation Date/Time:  Wednesday December 02 2023 14:10:44 EDT Ventricular Rate:  89 PR Interval:  190 QRS Duration:  90 QT Interval:  370 QTC Calculation: 450 R Axis:   62  Text Interpretation: Normal sinus rhythm T wave abnormality, consider inferolateral ischemia When compared with ECG of 02-Dec-2023 14:10, No significant change was found Confirmed by Veronica Guerrant ([id number]) on 12/02/2023 2:15:50 PM   ICD Interrogation-  reviewed in detail today,  See PACEART report.  Device History: Field seismologist ICD implanted 05/10/2019 for chronic systolic heart failure History of appropriate therapy: No History of AAD therapy: No   Risk Assessment/Calculations:             Physical Exam:   VS:  BP (!) 140/100 (BP Location: Left Arm, Patient Position: Sitting, Cuff Size: Large)   Pulse 89   Ht 5\' 8"  (1.727 m)   Wt 209 lb  (94.8 kg)   SpO2 97%   BMI 31.78 kg/m    Wt Readings from Last 3 Encounters:  12/02/23 209 lb (94.8 kg)  07/20/23 209 lb 3.2 oz (94.9 kg)  01/21/22 205 lb (93 kg)     GEN: Well nourished, well developed in no acute distress NECK: No JVD; No carotid bruits CARDIAC: Regular rate and rhythm, no murmurs, rubs, gallops RESPIRATORY:  Clear to auscultation without rales, wheezing or rhonchi  ABDOMEN: Soft, non-tender, non-distended EXTREMITIES:  No edema; No deformity   ASSESSMENT AND PLAN:    Chronic systolic dysfunction s/p Environmental manager dual chamber ICD  euvolemic today Stable on an appropriate medical regimen Normal ICD function Sensing, threshold, impedance within normal limits Programming reviewed and appropriate See Pace Art report Has that wire implanted.  Device has reached ERI.  Nadalyn Deringer need generator change.  Risk and benefits were discussed.  Risk of bleeding and infection.  The patient understands the risk and is agreed to the procedure.  2.  Hypertension: Elevated today.  He has follow-up with his primary cardiologist to discuss this further.  Disposition:   Follow up with EP APP as usual post procedure   Signed, Malaijah Houchen Cortland Ding, MD

## 2023-12-21 NOTE — Progress Notes (Signed)
 Yellow alert for HF. Remote report shows HeartLogic Index 16, this measurement has decreased from index 21 on previous report 12/14/23. Measurement will reset at Index 6. Will continue to monitor remote reports.  Powell Glatter, RN 12/21/23 3:32 PM

## 2023-12-23 NOTE — Progress Notes (Signed)
 Remote received

## 2023-12-29 NOTE — Progress Notes (Signed)
 Yellow alert for HF. Remote report shows HeartLogic Index 14, this measurement has decreased from index 16 on previous report 12/21/23. Measurement will reset at Index 6. Will continue to monitor remote reports.  Powell Glatter, RN 12/29/23 8:18 AM

## 2024-01-12 ENCOUNTER — Encounter (HOSPITAL_COMMUNITY): Payer: Self-pay

## 2024-01-12 ENCOUNTER — Telehealth: Payer: Self-pay | Admitting: Student

## 2024-01-12 NOTE — Telephone Encounter (Signed)
 Called pt to discuss symptoms.   Since expiration of his Barostim battery he has developed worsening fatigue and elevated HF diagnostics on his device.   Reviewed with Dr. Inocencio.  Will place direct admission orders for optimization and discuss changing his generator during that admission.   Pt is agreeable and aware of direct admission 01/13/2024. Patient placement and inpatient team contacted and also aware.   Ozell Jodie Passey, PA-C  01/12/2024 3:44 PM

## 2024-01-13 ENCOUNTER — Other Ambulatory Visit: Payer: Self-pay

## 2024-01-13 ENCOUNTER — Inpatient Hospital Stay (HOSPITAL_COMMUNITY)
Admission: EM | Admit: 2024-01-13 | Discharge: 2024-01-15 | DRG: 258 | Disposition: A | Discharge status: Home / Self Care | Attending: Cardiology | Admitting: Cardiology

## 2024-01-13 DIAGNOSIS — Z4509 Encounter for adjustment and management of other cardiac device: Secondary | ICD-10-CM | POA: Diagnosis not present

## 2024-01-13 DIAGNOSIS — I130 Hypertensive heart and chronic kidney disease with heart failure and stage 1 through stage 4 chronic kidney disease, or unspecified chronic kidney disease: Secondary | ICD-10-CM | POA: Diagnosis present

## 2024-01-13 DIAGNOSIS — N1831 Chronic kidney disease, stage 3a: Secondary | ICD-10-CM | POA: Diagnosis present

## 2024-01-13 DIAGNOSIS — I5023 Acute on chronic systolic (congestive) heart failure: Secondary | ICD-10-CM | POA: Diagnosis present

## 2024-01-13 DIAGNOSIS — E1122 Type 2 diabetes mellitus with diabetic chronic kidney disease: Secondary | ICD-10-CM | POA: Diagnosis present

## 2024-01-13 DIAGNOSIS — I5022 Chronic systolic (congestive) heart failure: Secondary | ICD-10-CM | POA: Diagnosis not present

## 2024-01-13 LAB — BRAIN NATRIURETIC PEPTIDE: B Natriuretic Peptide: 249.9 pg/mL — ABNORMAL HIGH (ref 0.0–100.0)

## 2024-01-13 LAB — CBC WITH DIFFERENTIAL/PLATELET
Abs Immature Granulocytes: 0.01 K/uL (ref 0.00–0.07)
Basophils Absolute: 0 K/uL (ref 0.0–0.1)
Basophils Relative: 1 %
Eosinophils Absolute: 0.2 K/uL (ref 0.0–0.5)
Eosinophils Relative: 4 %
HCT: 40.9 % (ref 39.0–52.0)
Hemoglobin: 13.3 g/dL (ref 13.0–17.0)
Immature Granulocytes: 0 %
Lymphocytes Relative: 40 %
Lymphs Abs: 1.9 K/uL (ref 0.7–4.0)
MCH: 28.9 pg (ref 26.0–34.0)
MCHC: 32.5 g/dL (ref 30.0–36.0)
MCV: 88.9 fL (ref 80.0–100.0)
Monocytes Absolute: 0.5 K/uL (ref 0.1–1.0)
Monocytes Relative: 11 %
Neutro Abs: 2.1 K/uL (ref 1.7–7.7)
Neutrophils Relative %: 44 %
Platelets: 284 K/uL (ref 150–400)
RBC: 4.6 MIL/uL (ref 4.22–5.81)
RDW: 13.5 % (ref 11.5–15.5)
WBC: 4.8 K/uL (ref 4.0–10.5)
nRBC: 0 % (ref 0.0–0.2)

## 2024-01-13 LAB — BASIC METABOLIC PANEL WITH GFR
Anion gap: 9 (ref 5–15)
BUN: 9 mg/dL (ref 6–20)
CO2: 26 mmol/L (ref 22–32)
Calcium: 9.4 mg/dL (ref 8.9–10.3)
Chloride: 105 mmol/L (ref 98–111)
Creatinine, Ser: 1.72 mg/dL — ABNORMAL HIGH (ref 0.61–1.24)
GFR, Estimated: 46 mL/min — ABNORMAL LOW (ref >60)
Glucose, Bld: 111 mg/dL — ABNORMAL HIGH (ref 70–99)
Potassium: 4 mmol/L (ref 3.5–5.1)
Sodium: 140 mmol/L (ref 135–145)

## 2024-01-13 LAB — MAGNESIUM: Magnesium: 2.1 mg/dL (ref 1.7–2.4)

## 2024-01-13 LAB — HIV ANTIBODY (ROUTINE TESTING W REFLEX): HIV Screen 4th Generation wRfx: NONREACTIVE

## 2024-01-13 MED ORDER — SODIUM CHLORIDE 0.9% FLUSH: 3.0000 mL | INTRAVENOUS | Status: DC | PRN

## 2024-01-13 MED ORDER — METOPROLOL SUCCINATE ER 100 MG PO TB24
100.0000 mg | ORAL_TABLET | Freq: Every day | ORAL | Status: DC
Start: 2024-01-14 — End: 2024-01-15
  Administered 2024-01-14 – 2024-01-15 (×2): 100 mg via ORAL
  Filled 2024-01-13 (×2): qty 1

## 2024-01-13 MED ORDER — FUROSEMIDE 10 MG/ML IJ SOLN
80.0000 mg | Freq: Once | INTRAMUSCULAR | Status: AC
  Administered 2024-01-13: 80 mg via INTRAVENOUS
  Filled 2024-01-13: qty 8

## 2024-01-13 MED ORDER — SACUBITRIL-VALSARTAN 49-51 MG PO TABS
1.0000 | ORAL_TABLET | Freq: Two times a day (BID) | ORAL | Status: DC
  Administered 2024-01-13 – 2024-01-15 (×4): 1 via ORAL
  Filled 2024-01-13 (×5): qty 1

## 2024-01-13 MED ORDER — ACETAMINOPHEN 325 MG PO TABS: 650.0000 mg | ORAL_TABLET | ORAL | Status: DC | PRN

## 2024-01-13 MED ORDER — ONDANSETRON HCL 4 MG/2ML IJ SOLN: 4.0000 mg | Freq: Four times a day (QID) | INTRAMUSCULAR | Status: DC | PRN

## 2024-01-13 MED ORDER — AMLODIPINE BESYLATE 10 MG PO TABS
10.0000 mg | ORAL_TABLET | Freq: Every day | ORAL | Status: DC
  Administered 2024-01-13 – 2024-01-15 (×3): 10 mg via ORAL
  Filled 2024-01-13 (×3): qty 1

## 2024-01-13 MED ORDER — ENOXAPARIN SODIUM 40 MG/0.4ML IJ SOSY
40.0000 mg | PREFILLED_SYRINGE | INTRAMUSCULAR | Status: AC
  Administered 2024-01-13: 40 mg via SUBCUTANEOUS
  Filled 2024-01-13 (×2): qty 0.4

## 2024-01-13 MED ORDER — ALLOPURINOL 100 MG PO TABS
200.0000 mg | ORAL_TABLET | Freq: Every day | ORAL | Status: DC
  Administered 2024-01-14 – 2024-01-15 (×2): 200 mg via ORAL
  Filled 2024-01-13 (×2): qty 2

## 2024-01-13 MED ORDER — SODIUM CHLORIDE 0.9% FLUSH
3.0000 mL | Freq: Two times a day (BID) | INTRAVENOUS | Status: DC
  Administered 2024-01-13 – 2024-01-15 (×4): 3 mL via INTRAVENOUS

## 2024-01-13 MED ORDER — SODIUM CHLORIDE 0.9 % IV SOLN: 250.0000 mL | INTRAVENOUS | Status: AC | PRN

## 2024-01-13 NOTE — H&P (Addendum)
 ELECTROPHYSIOLOGY CONSULT NOTE    Patient ID: Phillip Ruiz MRN: [id number], DOB/AGE: 1970-12-29 53 y.o.  Admit date: 01/13/2024 Date of Consult: 01/13/2024  Primary Physician: Henriette Anes, DO Primary Cardiologist: Lynwood Rakers, MD (Inactive)  Electrophysiologist: Dr. Inocencio   Reason for admission: Acute on chronic systolic CHF   Patient Profile: Phillip Ruiz is a 53 y.o. male with a history of HFrEF (EF = 20-25%), CKD III, DM2, and HTN who is being admitted for the evaluation of acute on chronic systolic CHF.   HPI:  Phillip Ruiz is a 53 y.o. male with medical history as above.   Pt followed by our team for [facility] management, otherwise managed by [facility] in [place].   Pt seen 07/2023 and 11/2023 for planning of Barostim Generator change with an estimated EOS date of 12/18/2023. Pt had excellent response to therapy, with improvements in his energy level and decreased SOB.     Unfortunately, his insurance company continued to deny generator replacement.   Pt called our office earlier this week with increasing HF symptoms. Fatigue had increased to the point he could not hold his head up.  He has also received alerts for elevated HF diagnostics via his ICD and Heart Logic Parameters.  He further reports having increase dyspnea over the past week.  Pt presents today for evaluation for his HF and discussion of generator change.   He denies chest pain, palpitations, PND, orthopnea, nausea, vomiting, dizziness, syncope, edema, weight gain, or early satiety.  On admission his VS were afebrile, HR 68, BP 143/90, RR 18 and satting 97% on RA.  Allergies, Medical, Surgical, Social, and Family Histories have been reviewed and are referenced here-in when relevant for medical decision making.    Physical Exam: Vitals:   01/13/24 1956  BP: (!) 143/90  Pulse: 68  Resp: 18  Temp: 98.7 F (37.1 C)  TempSrc: Oral  SpO2: 97%  Weight: 86.9 kg  Height: 5' 8 (1.727  m)    GEN- NAD, A&O x 3, normal affect HEENT: Normocephalic, atraumatic Lungs- CTAB, Normal effort.  Heart- Regular rate and rhythm, No M/G/R.  PPM pocket site on left chest c/d/I, Barostim pocket site on right chest c/d/i GI- Soft, NT, ND.  Extremities- No clubbing, cyanosis, or edema Neuro: CN II-XII grossly intact, moves all extremities Psych: A&Ox3, normal affect   Radiology/Studies: No results found.  EKG: Pending   TELEMETRY: NSR (personally reviewed)  DEVICE HISTORY:  Field seismologist ICD implanted 05/10/19 for Chronic systolic CHF  Barostim (BatWire) implanted 12/2020 for Chronic systolic CHF  Assessment/Plan:  Phillip Ruiz is a 53 y.o. male with a history of HFrEF (EF = 20-25%), CKD III, DM2, and HTN who is being admitted for the evaluation of acute on chronic systolic CHF.   #Acute on Chronic HFrEF #S/p Boston Sci ICD #S/p Barostim implantation :: Patient directly admitted for heart failure symptoms and expedited generator change.  Will give 1 dose of IV Lasix  for symptoms of dyspnea and elevated heart logic score.  Will make n.p.o. for procedure. -NPO at MN for gen change -give 80 mg IV lasix  x1 -continue home metoprolol  succinate 100 mg daily - Continue Entresto  49-51 mg twice daily   #HTN -Continue home amlodipine  10 mg daily      For questions or updates, please contact [facility] Please consult [email or web address] for contact info under     Signed, Georganna Archer, MD  01/13/2024, 9:20 AM

## 2024-01-14 ENCOUNTER — Encounter (HOSPITAL_COMMUNITY): Payer: Self-pay | Admitting: Certified Registered Nurse Anesthetist

## 2024-01-14 ENCOUNTER — Encounter (HOSPITAL_COMMUNITY): Payer: Self-pay | Admitting: Cardiology

## 2024-01-14 ENCOUNTER — Encounter (HOSPITAL_COMMUNITY)
Admission: EM | Disposition: A | Discharge status: Home / Self Care | Payer: Self-pay | Source: Home / Self Care | Attending: Cardiology

## 2024-01-14 DIAGNOSIS — Z4509 Encounter for adjustment and management of other cardiac device: Secondary | ICD-10-CM

## 2024-01-14 HISTORY — PX: BAROREFLEX GENERATOR CHANGEOUT: EP1256

## 2024-01-14 LAB — BASIC METABOLIC PANEL WITH GFR
Anion gap: 10 (ref 5–15)
BUN: 11 mg/dL (ref 6–20)
CO2: 28 mmol/L (ref 22–32)
Calcium: 9.5 mg/dL (ref 8.9–10.3)
Chloride: 103 mmol/L (ref 98–111)
Creatinine, Ser: 1.67 mg/dL — ABNORMAL HIGH (ref 0.61–1.24)
GFR, Estimated: 48 mL/min — ABNORMAL LOW (ref >60)
Glucose, Bld: 127 mg/dL — ABNORMAL HIGH (ref 70–99)
Potassium: 3.5 mmol/L (ref 3.5–5.1)
Sodium: 141 mmol/L (ref 135–145)

## 2024-01-14 LAB — MRSA NEXT GEN BY PCR, NASAL: MRSA by PCR Next Gen: NOT DETECTED

## 2024-01-14 LAB — SURGICAL PCR SCREEN
MRSA, PCR: NEGATIVE
Staphylococcus aureus: NEGATIVE

## 2024-01-14 SURGERY — BAROREFLEX GENERATOR CHANGEOUT
Anesthesia: General

## 2024-01-14 MED ORDER — SODIUM CHLORIDE 0.9 % IV SOLN
INTRAVENOUS | Status: AC
Start: 2024-01-14 — End: 2024-01-15
  Filled 2024-01-14: qty 2

## 2024-01-14 MED ORDER — CEFAZOLIN SODIUM-DEXTROSE 2-4 GM/100ML-% IV SOLN
INTRAVENOUS | Status: AC
  Administered 2024-01-14: 2 g via INTRAVENOUS
  Filled 2024-01-14: qty 100

## 2024-01-14 MED ORDER — LIDOCAINE HCL (PF) 1 % IJ SOLN
INTRAMUSCULAR | Status: DC | PRN
  Administered 2024-01-14: 60 mL

## 2024-01-14 MED ORDER — CEFAZOLIN SODIUM-DEXTROSE 2-4 GM/100ML-% IV SOLN: 2.0000 g | INTRAVENOUS | Status: AC

## 2024-01-14 MED ORDER — SODIUM CHLORIDE 0.9 % IV SOLN: INTRAVENOUS | Status: DC

## 2024-01-14 MED ORDER — LIDOCAINE HCL (PF) 1 % IJ SOLN
INTRAMUSCULAR | Status: AC
  Filled 2024-01-14: qty 60

## 2024-01-14 MED ORDER — MIDAZOLAM HCL 2 MG/2ML IJ SOLN
INTRAMUSCULAR | Status: DC | PRN
  Administered 2024-01-14: 2 mg via INTRAVENOUS

## 2024-01-14 MED ORDER — MIDAZOLAM HCL 2 MG/2ML IJ SOLN
INTRAMUSCULAR | Status: AC
  Filled 2024-01-14: qty 2

## 2024-01-14 MED ORDER — FENTANYL CITRATE (PF) 100 MCG/2ML IJ SOLN
INTRAMUSCULAR | Status: DC | PRN
  Administered 2024-01-14: 25 ug via INTRAVENOUS

## 2024-01-14 MED ORDER — SODIUM CHLORIDE 0.9 % IV SOLN
80.0000 mg | INTRAVENOUS | Status: AC
  Administered 2024-01-14: 80 mg

## 2024-01-14 MED ORDER — FENTANYL CITRATE (PF) 100 MCG/2ML IJ SOLN
INTRAMUSCULAR | Status: AC
  Filled 2024-01-14: qty 2

## 2024-01-14 SURGICAL SUPPLY — 3 items
GENERATOR IPG BAROSTIM 2104 (Generator) IMPLANT
KIT INSTRUMENT PACEMAKER INSER (INSTRUMENTS) IMPLANT
TRAY PACEMAKER INSERTION (PACKS) IMPLANT

## 2024-01-14 NOTE — TOC CM/SW Note (Signed)
 Transition of Care [facility]) - Inpatient Brief Assessment   Patient Details  Name: RION CATALA MRN: [id number] Date of Birth: 01/20/1971  [facility] of Care [facility]) CM/SW Contact:    Lauraine FORBES Saa, LCSW Phone Number: 01/14/2024, 8:54 AM   Clinical Narrative:  8:54 AM Per chart review, patient resides at home with parent(s). Patient has a PCP and insurance. Patient does not have SNF/HH/DME history. Patient's preferred pharmacy is [facility] [address], [place]. TOC consult was placed for [facility] screening and SNF placement. TOC will continue to follow and be available to assist.  Transition of Care Asessment: Insurance and Status: Insurance coverage has been reviewed Patient has primary care physician: Yes Home environment has been reviewed: Private Residence Prior level of function:: N/A Prior/Current Home Services: No current home services Social Drivers of Health Review: SDOH reviewed no interventions necessary Readmission risk has been reviewed: Yes Transition of care needs: transition of care needs identified, TOC will continue to follow

## 2024-01-14 NOTE — Progress Notes (Signed)
 Heart Failure Navigator Progress Note  Assessed for Heart & Vascular TOC clinic readiness.  Patient does not meet criteria due to see's [facility] clinic cardiology , No HF TOC.   Navigator will sign off at this time.   Stephane Haddock, BSN, Scientist, clinical (histocompatibility and immunogenetics) Only

## 2024-01-14 NOTE — Progress Notes (Addendum)
  Patient Name: Phillip Ruiz Date of Encounter: 01/14/2024  Primary Cardiologist: Lynwood Rakers, MD (Inactive) Electrophysiologist: Rosalee Tolley Gladis Norton, MD  Interval Summary   The patient reports no acute concerns -sleeping on entry to room.  Post lasix  > 1.1L UOP  At this time, the patient denies chest pain, shortness of breath, or any new concerns.  Vital Signs    Vitals:   01/13/24 2329 01/14/24 0515 01/14/24 0519 01/14/24 0741  BP: (!) 134/91 (!) 126/95  (!) 146/100  Pulse: 84 80  79  Resp:  16  15  Temp: 98 F (36.7 C) 97.9 F (36.6 C)  97.9 F (36.6 C)  TempSrc: Oral Oral  Oral  SpO2: 94% 95%    Weight:   85.5 kg   Height:        Intake/Output Summary (Last 24 hours) at 01/14/2024 0933 Last data filed at 01/14/2024 0519 Gross per 24 hour  Intake --  Output 1100 ml  Net -1100 ml   Filed Weights   01/13/24 1956 01/14/24 0519  Weight: 86.9 kg 85.5 kg    Physical Exam    GEN- NAD, Alert and oriented  Lungs- Clear to ausculation bilaterally, normal work of breathing Cardiac- Regular rate and rhythm, no murmurs, rubs or gallops GI- soft, NT, ND, + BS Extremities- no clubbing or cyanosis. No edema  Telemetry    SR 60-80's, intermittent artifact from Barostim (personally reviewed)  Hospital Course    Phillip Ruiz is a 53 y.o. male with a history of HFrEF (EF = 20-25%), CKD III, DM2, and HTN admitted for acute on chronic systolic HF.  He is followed by EP for [facility] management, otherwise follows in the [facility] in [place]. Pt was treated with IV diuresis on admission in the setting of dyspnea and elevated heart logic score.       Assessment & Plan    Acute on Chronic Systolic HF  Boston Sci ICD in Situ  Barostim in Situ  -follow I/O's  -trend BMP with active diuresis  -continue Toprol  100 mg daily  -Entresto  49-51 mg BID  -NPO for Barostim generator change, procedure discussed with patient by Dr. Norton   Hypertension  -well controlled  on current regimen        For questions or updates, please contact [facility] Please consult [email or web address] for contact info under     Signed, Daphne Barrack, NP-C, AGACNP-BC [facility] - Electrophysiology  01/14/2024, 9:38 AM  I have seen and examined this patient with Daphne Barrack.  Agree with above, note added to reflect my findings.  Admitted yesterday with chronic systolic heart failure.  Received Lasix  with good urine output of 1.1 L.  Shortness of breath has improved.  GEN: No acute distress.   Neck: No JVD Cardiac: RRR, no murmurs, rubs, or gallops.  Respiratory: normal BS bases bilaterally. GI: Soft, nontender, non-distended  MS: No edema; No deformity. Neuro:  Nonfocal  Skin: warm and dry, device site well healed Psych: Normal affect    Acute on chronic systolic heart failure: Currently on optimal medical therapy.  He has a Dentist which is now ERI.  Phillip Ruiz plan for generator change today.  Risks and benefits have been discussed.  Risk include bleeding and infection.  He understands the risks and is agreed to the procedure. Hypertension: Currently well-controlled  Taylore Hinde M. Kalei Meda MD 01/14/2024 1:23 PM

## 2024-01-14 NOTE — Plan of Care (Signed)
  Problem: Education: Goal: Knowledge of General Education information will improve Description: Including pain rating scale, medication(s)/side effects and non-pharmacologic comfort measures Outcome: Progressing   Problem: Health Behavior/Discharge Planning: Goal: Ability to manage health-related needs will improve Outcome: Progressing   Problem: Clinical Measurements: Goal: Ability to maintain clinical measurements within normal limits will improve Outcome: Progressing Goal: Will remain free from infection Outcome: Progressing Goal: Diagnostic test results will improve Outcome: Progressing Goal: Respiratory complications will improve Outcome: Progressing Goal: Cardiovascular complication will be avoided Outcome: Progressing   Problem: Activity: Goal: Risk for activity intolerance will decrease Outcome: Progressing   Problem: Nutrition: Goal: Adequate nutrition will be maintained Outcome: Progressing   Problem: Coping: Goal: Level of anxiety will decrease Outcome: Progressing   Problem: Elimination: Goal: Will not experience complications related to bowel motility Outcome: Progressing Goal: Will not experience complications related to urinary retention Outcome: Progressing   Problem: Pain Managment: Goal: General experience of comfort will improve and/or be controlled Outcome: Progressing   Problem: Safety: Goal: Ability to remain free from injury will improve Outcome: Progressing   Problem: Skin Integrity: Goal: Risk for impaired skin integrity will decrease Outcome: Progressing   Problem: Education: Goal: Ability to demonstrate management of disease process will improve Outcome: Progressing Goal: Ability to verbalize understanding of medication therapies will improve Outcome: Progressing Goal: Individualized Educational Video(s) Outcome: Progressing   Problem: Activity: Goal: Capacity to carry out activities will improve Outcome: Progressing    Problem: Cardiac: Goal: Ability to achieve and maintain adequate cardiopulmonary perfusion will improve Outcome: Progressing   Problem: Education: Goal: Knowledge of cardiac device and self-care will improve Outcome: Progressing Goal: Ability to safely manage health related needs after discharge will improve Outcome: Progressing Goal: Individualized Educational Video(s) Outcome: Progressing   Problem: Cardiac: Goal: Ability to achieve and maintain adequate cardiopulmonary perfusion will improve Outcome: Progressing

## 2024-01-14 NOTE — Progress Notes (Signed)
 Pt assessed to be laying on affected side post-procedure, drainage on the dressing was marked, pt educated on not turning to the affected side for 24-hours. Report from previous primary RN was that pt had been educated to not turn to the affected side. Will continue to monitor.  Lonell LITTIE Lyme, RN

## 2024-01-14 NOTE — Anesthesia Preprocedure Evaluation (Deleted)
 Anesthesia Evaluation  Patient identified by MRN, date of birth, ID band Patient awake    Reviewed: Allergy & Precautions, NPO status , Patient's Chart, lab work & pertinent test results  Airway Mallampati: II  TM Distance: >3 FB Neck ROM: Full    Dental  (+) Teeth Intact, Dental Advisory Given   Pulmonary neg pulmonary ROS   Pulmonary exam normal breath sounds clear to auscultation       Cardiovascular hypertension, Pt. on medications +CHF  Normal cardiovascular exam+ pacemaker  Rhythm:Regular Rate:Normal  S/p baroreceptor implant  Echo 01/21/22: 1. Left ventricular ejection fraction, by estimation, is 20 to 25%. The  left ventricle has severely decreased function. The left ventricle  demonstrates global hypokinesis. There is moderate concentric left  ventricular hypertrophy. Left ventricular  diastolic parameters are indeterminate.   2. Right ventricular systolic function is normal. The right ventricular  size is normal.   3. The mitral valve is normal in structure. Trivial mitral valve  regurgitation. No evidence of mitral stenosis.   4. The aortic valve is normal in structure. Aortic valve regurgitation is  not visualized. No aortic stenosis is present.   5. The inferior vena cava is normal in size with greater than 50%  respiratory variability, suggesting right atrial pressure of 3 mmHg.     Neuro/Psych  PSYCHIATRIC DISORDERS Anxiety     negative neurological ROS     GI/Hepatic negative GI ROS, Neg liver ROS,,,  Endo/Other  diabetes, Type 2, Insulin Dependent    Renal/GU Renal InsufficiencyRenal disease     Musculoskeletal negative musculoskeletal ROS (+)    Abdominal   Peds  Hematology negative hematology ROS (+)   Anesthesia Other Findings Day of surgery medications reviewed with the patient.  Reproductive/Obstetrics                              Anesthesia  Physical Anesthesia Plan  ASA: 4  Anesthesia Plan: General   Post-op Pain Management: Tylenol  PO (pre-op )*   Induction: Intravenous  PONV Risk Score and Plan: 2 and Dexamethasone  and Ondansetron   Airway Management Planned: Oral ETT and LMA  Additional Equipment:   Intra-op Plan:   Post-operative Plan: Extubation in OR  Informed Consent: I have reviewed the patients History and Physical, chart, labs and discussed the procedure including the risks, benefits and alternatives for the proposed anesthesia with the patient or authorized representative who has indicated his/her understanding and acceptance.     Dental advisory given  Plan Discussed with: CRNA  Anesthesia Plan Comments:          Anesthesia Quick Evaluation

## 2024-01-15 ENCOUNTER — Encounter (HOSPITAL_COMMUNITY): Payer: Self-pay | Admitting: Cardiology

## 2024-01-15 DIAGNOSIS — I5023 Acute on chronic systolic (congestive) heart failure: Secondary | ICD-10-CM

## 2024-01-15 LAB — BASIC METABOLIC PANEL WITH GFR
Anion gap: 10 (ref 5–15)
BUN: 18 mg/dL (ref 6–20)
CO2: 26 mmol/L (ref 22–32)
Calcium: 9.5 mg/dL (ref 8.9–10.3)
Chloride: 102 mmol/L (ref 98–111)
Creatinine, Ser: 1.65 mg/dL — ABNORMAL HIGH (ref 0.61–1.24)
GFR, Estimated: 49 mL/min — ABNORMAL LOW (ref >60)
Glucose, Bld: 147 mg/dL — ABNORMAL HIGH (ref 70–99)
Potassium: 3.4 mmol/L — ABNORMAL LOW (ref 3.5–5.1)
Sodium: 138 mmol/L (ref 135–145)

## 2024-01-15 MED ORDER — METOPROLOL SUCCINATE ER 100 MG PO TB24: 100.0000 mg | ORAL_TABLET | Freq: Every day | ORAL | Status: DC

## 2024-01-15 MED ORDER — ASPIRIN 81 MG PO TBEC: 81.0000 mg | DELAYED_RELEASE_TABLET | Freq: Every day | ORAL | Status: AC | PRN

## 2024-01-15 MED ORDER — POTASSIUM CHLORIDE CRYS ER 20 MEQ PO TBCR
20.0000 meq | EXTENDED_RELEASE_TABLET | Freq: Two times a day (BID) | ORAL | Status: DC
  Administered 2024-01-15: 20 meq via ORAL
  Filled 2024-01-15: qty 1

## 2024-01-15 MED ORDER — POTASSIUM CHLORIDE 20 MEQ PO PACK: 20.0000 meq | PACK | Freq: Two times a day (BID) | ORAL | Status: DC

## 2024-01-15 NOTE — Plan of Care (Signed)
  Problem: Education: Goal: Knowledge of General Education information will improve Description: Including pain rating scale, medication(s)/side effects and non-pharmacologic comfort measures Outcome: Adequate for Discharge   Problem: Health Behavior/Discharge Planning: Goal: Ability to manage health-related needs will improve Outcome: Adequate for Discharge   Problem: Clinical Measurements: Goal: Ability to maintain clinical measurements within normal limits will improve Outcome: Adequate for Discharge Goal: Will remain free from infection Outcome: Adequate for Discharge Goal: Diagnostic test results will improve Outcome: Adequate for Discharge Goal: Respiratory complications will improve Outcome: Adequate for Discharge Goal: Cardiovascular complication will be avoided Outcome: Adequate for Discharge   Problem: Activity: Goal: Risk for activity intolerance will decrease Outcome: Adequate for Discharge   Problem: Nutrition: Goal: Adequate nutrition will be maintained Outcome: Adequate for Discharge   Problem: Coping: Goal: Level of anxiety will decrease Outcome: Adequate for Discharge   Problem: Elimination: Goal: Will not experience complications related to bowel motility Outcome: Adequate for Discharge Goal: Will not experience complications related to urinary retention Outcome: Adequate for Discharge   Problem: Pain Managment: Goal: General experience of comfort will improve and/or be controlled Outcome: Adequate for Discharge   Problem: Safety: Goal: Ability to remain free from injury will improve Outcome: Adequate for Discharge   Problem: Skin Integrity: Goal: Risk for impaired skin integrity will decrease Outcome: Adequate for Discharge   Problem: Education: Goal: Ability to demonstrate management of disease process will improve Outcome: Adequate for Discharge Goal: Ability to verbalize understanding of medication therapies will improve Outcome: Adequate  for Discharge Goal: Individualized Educational Video(s) Outcome: Adequate for Discharge   Problem: Activity: Goal: Capacity to carry out activities will improve Outcome: Adequate for Discharge   Problem: Cardiac: Goal: Ability to achieve and maintain adequate cardiopulmonary perfusion will improve Outcome: Adequate for Discharge   Problem: Education: Goal: Knowledge of cardiac device and self-care will improve Outcome: Adequate for Discharge Goal: Ability to safely manage health related needs after discharge will improve Outcome: Adequate for Discharge Goal: Individualized Educational Video(s) Outcome: Adequate for Discharge   Problem: Cardiac: Goal: Ability to achieve and maintain adequate cardiopulmonary perfusion will improve Outcome: Adequate for Discharge

## 2024-01-15 NOTE — Discharge Instructions (Signed)

## 2024-01-15 NOTE — Discharge Summary (Addendum)
 ELECTROPHYSIOLOGY DISCHARGE SUMMARY    Patient ID: Phillip Ruiz,  MRN: [id number], DOB/AGE: [birth year] 53 y.o.  Admit date: 01/13/2024 Discharge date: 01/15/2024  Primary Care Physician: Henriette Anes, DO  Primary Cardiologist: Lynwood Rakers, MD (Inactive)  Electrophysiologist: Dr. Inocencio   Primary Discharge Diagnosis:  HFrEF with Acute Exacerbation   Secondary Discharge Diagnosis:  Barostim in Situ  [employer] Dual Chamber ICD (DOI 05/10/19) HTN CKD III DM II   Procedures This Admission:  Barostim Generator Change       Brief HPI: Phillip Ruiz is a 53 y.o. male with a history of HFrEF (LVEF 20-25%, s/p Barostim, HTN, DM II admitted for decompensated systolic heart failure.   The patient is followed by EP for [facility] management, otherwise by the [facility] in [place].  He was seen 07/2023 and 11/2023 for planning of Barostim Generator change with an estimated EOS date of 12/18/2023. Pt had excellent response to therapy, with improvements in his energy level and decreased SOB. Unfortunately, his insurance company continued to deny generator replacement. The patient called the clinic 01/12/24 with increasing HF symptoms. Fatigue had increased to the point he could not hold his head up.  He has also received alerts for elevated HF diagnostics via his ICD and Heart Logic Parameters.  He further reports having increase dyspnea over the past week.     Pt presents today for evaluation for his HF and discussion of generator change.    Hospital Course:  The patient was admitted for decompensated systolic HF. He was diuresed with good response.  Electrolytes (K+) were supplemented. They were monitored on telemetry which demonstrated SR 60-70's with intermittent Barostim artifact. The patient was examined 01/15/24 by Dr. Christiane and considered to be stable for discharge.  Wound care and restrictions were reviewed with the patient.  The patient will be seen back by the Device  Clinic in 2 weeks and EP APP in 3 months for [facility] post hospital care.   Physical Exam: Vitals:   01/14/24 2344 01/15/24 0259 01/15/24 0412 01/15/24 0801  BP: 124/81 (!) 141/94  (!) 113/96  Pulse: 65 75  74  Resp: 19 20  16   Temp: 97.9 F (36.6 C) 98.3 F (36.8 C)  98.4 F (36.9 C)  TempSrc: Oral Oral  Oral  SpO2: 95% 95%  96%  Weight:   86.5 kg   Height:        GEN- NAD. A&O x 3.  HEENT: Normocephalic, atraumatic Lungs- CTAB, Normal effort.  Heart- RRR, No M/G/R. Right chest site with steri-strips intact clean/dry, small amt swelling GI- Soft, NT, ND.  Extremities- No clubbing, cyanosis, or edema   Discharge Medications:  Allergies as of 01/15/2024   No Known Allergies      Medication List     TAKE these medications    allopurinol  100 MG tablet Commonly known as: ZYLOPRIM  Take 200 mg by mouth daily.   amLODipine  10 MG tablet Commonly known as: NORVASC  Take 10 mg by mouth daily.   aspirin  EC 81 MG tablet Take 1 tablet (81 mg total) by mouth daily as needed (for heart). Start taking on: January 20, 2024 What changed: These instructions start on January 20, 2024. If you are unsure what to do until then, ask your doctor or other care provider.   calcitRIOL 0.5 MCG capsule Commonly known as: ROCALTROL TAKE ONE (1) CAPSULE BY MOUTH DAILY   Entresto  49-51 MG Generic drug: sacubitril -valsartan  Take 1 tablet by mouth 2 (two)  times daily.   hydrALAZINE  25 MG tablet Commonly known as: APRESOLINE  Take 25 mg by mouth 2 (two) times daily.   metoprolol  succinate 100 MG 24 hr tablet Commonly known as: TOPROL -XL Take 1 tablet (100 mg total) by mouth daily.   MULTIVITAMIN PO Take 1 tablet by mouth daily.   NovoLOG FlexPen 100 UNIT/ML FlexPen Generic drug: insulin aspart Inject 10 Units into the skin 3 (three) times daily with meals.   Ozempic (1 MG/DOSE) 4 MG/3ML Sopn Generic drug: Semaglutide (1 MG/DOSE) INJECT 1 MG SUBCUTANEOUSLY ONCE A WEEK   sildenafil  100 MG tablet Commonly known as: VIAGRA Take 100 mg by mouth as needed. For Sexual activity   torsemide 20 MG tablet Commonly known as: DEMADEX Take 40 mg by mouth daily as needed (for fluid).        Disposition: Home with usual follow up as in AVS  Duration of Discharge Encounter:  APP time: 33 minutes  Signed, Daphne Barrack, NP-C, AGACNP-BC [facility] - Electrophysiology  01/15/2024, 10:25 AM

## 2024-01-15 NOTE — TOC Transition Note (Signed)
 Transition of Care [facility]) - Discharge Note   Patient Details  Name: Phillip Ruiz MRN: [id number] Date of Birth: 10/20/1970  Transition of Care [facility]) CM/SW Contact:  Roxie KANDICE Stain, RN Phone Number: 01/15/2024, 12:19 PM   Clinical Narrative:    Patient stable for discharge.  Patient has a scale, follows up with cardiologist and can afford prescriptions.  Patient to follow up with PCP.   Final next level of care: Home/Self Care Barriers to Discharge: Barriers Resolved   Patient Goals and CMS Choice Patient states their goals for this hospitalization and ongoing recovery are:: return home          Discharge Placement                 Home      Discharge Plan and Services Additional resources added to the After Visit Summary for                                       Social Drivers of Health (SDOH) Interventions SDOH Screenings   Food Insecurity: No Food Insecurity (01/13/2024)  Housing: Low Risk  (01/13/2024)  Transportation Needs: No Transportation Needs (01/13/2024)  Utilities: Not At Risk (01/13/2024)  Tobacco Use: Low Risk  (01/14/2024)     Readmission Risk Interventions     No data to display

## 2024-01-28 ENCOUNTER — Ambulatory Visit: Attending: Cardiology

## 2024-01-28 DIAGNOSIS — I5022 Chronic systolic (congestive) heart failure: Secondary | ICD-10-CM

## 2024-01-28 NOTE — Patient Instructions (Addendum)
   After Your Barostim   Monitor your incision site for redness, swelling, and drainage. Call the device clinic at [phone] if you experience these symptoms or fever/chills.

## 2024-01-28 NOTE — Progress Notes (Signed)
 Barostim incision checked. No swelling, redness, drainage noted. Wound care education provided. Patient advised to call w/ any signs or symptoms of infection.

## 2024-02-10 ENCOUNTER — Telehealth: Payer: Self-pay

## 2024-02-10 ENCOUNTER — Ambulatory Visit: Attending: Cardiovascular Disease

## 2024-02-10 DIAGNOSIS — I5022 Chronic systolic (congestive) heart failure: Secondary | ICD-10-CM

## 2024-02-10 NOTE — Patient Instructions (Signed)
 Follow up as scheduled.

## 2024-02-10 NOTE — Telephone Encounter (Signed)
 Call back received from Pt.  Aware of appt.  No conflicts.

## 2024-02-10 NOTE — Telephone Encounter (Signed)
 Attempted to contact Pt.  Call went to VM.  Left detailed message advising Pt appointment scheduled for :  February 15, 2024 at 11:40 am with Jodie Passey.  Will attempt to verify with Pt.

## 2024-02-10 NOTE — Progress Notes (Signed)
 Pt seen in device clinic for assumed neck stimulation related to Baroreflex device.  Per Shanda (Barostim representative), Pt was a prior Pt under BatWire study.  When he developed discomfort after his generator change he called his prior contact with that study at the hospital to report.  Per Shanda she had been advised that Pt was having discomfort with lowering his head causing stim in his neck.    Pt subsequently was scheduled at [facility] Device clinic for device reprogramming.  Pt assessed by Shanda.  Per Shanda, Pt is not having stim.  When Pt attempts to look up even slightly the wire from the generator pulls and he has to put his head back down.  This can be palpated and visualized.    Shanda did turn down the output slightly for Pt that minimally increased comfort until he can be evaluated by Dr. Kelli Passey.  Will schedule Pt for next available with Jodie Passey for assessment.

## 2024-02-14 NOTE — Progress Notes (Deleted)
  Cardiology Office Note:   Date:  02/14/2024  ID:  Phillip Ruiz, DOB 27-Aug-1970, MRN [id number]  Primary Cardiologist: Lynwood Rakers, MD (Inactive) Electrophysiologist: Will Gladis Norton, MD   History of Present Illness:   Phillip Ruiz is a 53 y.o. male with h/o Chronic systolic CHF, HFpEF, CKD III, DM2, and HTN  seen today for acute visit due to discomfort s/p Barostim gen change.    Patient reports ***.  he denies chest pain, palpitations, dyspnea, PND, orthopnea, nausea, vomiting, dizziness, syncope, edema, weight gain, or early satiety.     Review of systems complete and found to be negative unless listed in HPI.    EP Information / Studies Reviewed:    EKG is not ordered today.   Arrhythmia/Device History Field seismologist ICD implanted 05/10/19 for Chronic systolic CHF  Barostim (BatWire) implanted 12/2020 for Chronic systolic CHF    Barostim Interrogation- Performed personally and reviewed in detail today,  See scanned report  ICD/PPM interrogation - Not performed today. Device is managed elsewhere   Physical Exam:   VS:  There were no vitals taken for this visit.   Wt Readings from Last 3 Encounters:  01/15/24 190 lb 11.2 oz (86.5 kg)  12/02/23 209 lb (94.8 kg)  07/20/23 209 lb 3.2 oz (94.9 kg)     GEN: Well nourished, well developed in no acute distress NECK: No JVD; No carotid bruits CARDIAC: {EPRHYTHM:28826}, no murmurs, rubs, gallops RESPIRATORY:  Clear to auscultation without rales, wheezing or rhonchi  ABDOMEN: Soft, non-tender, non-distended EXTREMITIES:  {EDEMA LEVEL:28147::No} edema; No deformity   ASSESSMENT AND PLAN:    Chronic systolic CHF s/p Chief Financial Officer and Barostim implantation NYHA *** symptoms. ***  Device {Blank single:19197::titrated from *** millamp to *** milliamp by increments of 0.4 with good blood pressure response and no stimulation.,programmed at *** for chronic settings}  Device  impedence stable. Pt goals are to ***  Normal device function See scanned report. Will follow up in *** weeks to continue titration with goal of 6-8 milliamps for chronic settings.   HTN Stable on current regimen   Disposition:   Follow up with {EPPROVIDERS:28135::EP Team} in {EPFOLLOW UP:28173}  Signed, Ozell Prentice Passey, PA-C

## 2024-02-15 ENCOUNTER — Ambulatory Visit: Admitting: Student

## 2024-02-15 DIAGNOSIS — I10 Essential (primary) hypertension: Secondary | ICD-10-CM

## 2024-02-15 DIAGNOSIS — I5022 Chronic systolic (congestive) heart failure: Secondary | ICD-10-CM

## 2024-03-07 ENCOUNTER — Encounter: Payer: Self-pay | Admitting: Student

## 2024-03-07 ENCOUNTER — Telehealth: Payer: Self-pay

## 2024-03-07 ENCOUNTER — Ambulatory Visit (HOSPITAL_COMMUNITY)
Admission: RE | Admit: 2024-03-07 | Discharge: 2024-03-07 | Disposition: A | Discharge status: Home / Self Care | Source: Ambulatory Visit | Attending: Student | Admitting: Student

## 2024-03-07 ENCOUNTER — Ambulatory Visit: Attending: Student | Admitting: Student

## 2024-03-07 ENCOUNTER — Other Ambulatory Visit: Payer: Self-pay

## 2024-03-07 VITALS — BP 146/82 | HR 88 | Ht 68.0 in | Wt 189.0 lb

## 2024-03-07 DIAGNOSIS — T85191D Other mechanical complication of implanted electronic neurostimulator (electrode) of peripheral nerve, subsequent encounter: Secondary | ICD-10-CM | POA: Diagnosis not present

## 2024-03-07 DIAGNOSIS — I5022 Chronic systolic (congestive) heart failure: Secondary | ICD-10-CM

## 2024-03-07 DIAGNOSIS — T859XXA Unspecified complication of internal prosthetic device, implant and graft, initial encounter: Secondary | ICD-10-CM | POA: Diagnosis not present

## 2024-03-07 NOTE — Telephone Encounter (Signed)
 Pt is scheduled for Barostim Pocket Revision with Dr. Inocencio on 9/18 at 12:30 pm w/anesthesia. Dr. Serene to be there as backup.   Pt states he does not use his MyChart so I went over all the details of his procedure and he wrote everything down.  He stated that he does NOT use Lantus anymore - he said the only shot he takes is Ozempic on Sundays.  His is to hold ASA x 3 days per WC.   I will mail his Instruction letter to him but not sure he will get it in time. He knows where to go and what time to be there. We went over all details and he repeated everything to me as he wrote it down.

## 2024-03-07 NOTE — Progress Notes (Signed)
  Cardiology Office Note:   Date:  03/07/2024  ID:  Phillip Ruiz, DOB May 30, 1971, MRN [id number]  Primary Cardiologist: Lynwood Rakers, MD (Inactive) Electrophysiologist: Will Gladis Norton, MD   History of Present Illness:   Phillip Ruiz is a 53 y.o. male with h/o Chronic systolic CHF, HFpEF, CKD III, DM2, and HTN  seen today for acute visit due to discomfort s/p Barostim gen change.    Patient reports since gen change, he has been able to extend his neck without significant discomfort. He feels mechanical tightness and pulling along the length of the wire; He is physically uncomfortable on exam.   Review of systems complete and found to be negative unless listed in HPI.    EP Information / Studies Reviewed:    EKG is not ordered today.   Arrhythmia/Device History Field seismologist ICD implanted 05/10/19 for Chronic systolic CHF  Barostim (BatWire) implanted 12/2020 for Chronic systolic CHF    Barostim Interrogation- Performed personally and reviewed in detail today,  See scanned report  ICD/PPM interrogation - Not performed today. Device is managed elsewhere   Physical Exam:   VS:  BP (!) 146/82   Pulse 88   Ht 5' 8 (1.727 m)   Wt 189 lb (85.7 kg)   SpO2 99%   BMI 28.74 kg/m    Wt Readings from Last 3 Encounters:  03/07/24 189 lb (85.7 kg)  01/15/24 190 lb 11.2 oz (86.5 kg)  12/02/23 209 lb (94.8 kg)     GEN: Chin tucked down, unable to look up without grimacing in pain and quickly looking back down  NECK: No JVD; No carotid bruits CARDIAC: Regular rate and rhythm, no murmurs, rubs, gallops RESPIRATORY:  Clear to auscultation without rales, wheezing or rhonchi  ABDOMEN: Soft, non-tender, non-distended EXTREMITIES:  No edema; No deformity   ASSESSMENT AND PLAN:    Chronic systolic CHF s/p Chief Financial Officer and Barostim implantation  Lead complication Since gen change, pt has been able to extend his neck without significant,  mechanical discomfort felt along the lead.  Dr. Serene assessed in office and thinks perhaps the slack in the lead got pulled down into the pocket and is impeding movement above the clavicle.   Will get imaging and discuss between Dr. Norton and Dr. Serene next best steps.   HTN Stable on current regimen   Disposition:   Follow up pending planning after imaging today.   Signed, Ozell Prentice Passey, PA-C

## 2024-03-07 NOTE — Patient Instructions (Addendum)
 Medication Instructions:  Your physician recommends that you continue on your current medications as directed. Please refer to the Current Medication list given to you today.  *If you need a refill on your cardiac medications before your next appointment, please call your pharmacy*  Lab Work: None ordered If you have labs (blood work) drawn today and your tests are completely normal, you will receive your results only by: MyChart Message (if you have MyChart) OR A paper copy in the mail If you have any lab test that is abnormal or we need to change your treatment, we will call you to review the results.  Testing/Procedures: Your provider has ordered for you to have a chest and neck x-ray. A chest x-ray takes a picture of the organs and structures inside the chest, including the heart, lungs, and blood vessels. This test can show several things, including, whether the heart is enlarges; whether fluid is building up in the lungs; and whether pacemaker / defibrillator leads are still in place.   Follow-Up: At [facility], you and your health needs are our priority.  As part of our continuing mission to provide you with exceptional heart care, our providers are all part of one team.  This team includes your primary Cardiologist (physician) and Advanced Practice Providers or APPs (Physician Assistants and Nurse Practitioners) who all work together to provide you with the care you need, when you need it.  Your next appointment:   To be determined based on testing today.

## 2024-03-09 ENCOUNTER — Telehealth: Payer: Self-pay

## 2024-03-09 NOTE — Telephone Encounter (Signed)
 Attempted to call for surgery scheduling. LVM

## 2024-03-14 ENCOUNTER — Ambulatory Visit: Payer: Self-pay | Admitting: Student

## 2024-03-15 ENCOUNTER — Other Ambulatory Visit: Payer: Self-pay

## 2024-03-15 ENCOUNTER — Encounter (HOSPITAL_COMMUNITY): Payer: Self-pay | Admitting: Surgery

## 2024-03-15 DIAGNOSIS — I5022 Chronic systolic (congestive) heart failure: Secondary | ICD-10-CM

## 2024-03-15 NOTE — Anesthesia Preprocedure Evaluation (Addendum)
 Anesthesia Evaluation  Patient identified by MRN, date of birth, ID band Patient awake    Reviewed: Allergy & Precautions, NPO status , Patient's Chart, lab work & pertinent test results  History of Anesthesia Complications Negative for: history of anesthetic complications  Airway Mallampati: II  TM Distance: >3 FB Neck ROM: Limited   Comment: Limited ROM due to tugging from fractured lead Dental  (+) Dental Advisory Given   Pulmonary    breath sounds clear to auscultation       Cardiovascular hypertension, Pt. on home beta blockers +CHF  + Cardiac Defibrillator  Rhythm:Regular Rate:Normal  TTE 12/10/2022 (Care Everywhere): Summary   1. Overall left ventricular ejection fraction is estimated at 20 to 25%.   2. Severely decreased global left ventricular systolic function.   3. (Grade 1) Mildly abnormal left ventricular diastolic filling.   4. Mild concentric left ventricular hypertrophy.   5. Mild to moderately increased left ventricular internal cavity size.   6. Mild mitral valve regurgitation.   7. Mild to moderate mitral annular calcification.    ICD/PPM: (01/2024) - Presenting Rhythm: AS/VS; 11 years battery longevity  - Pacing Percentages:  AP 1 % RVP 0 %  - Rates range 60-120 with normal distribution.        Neuro/Psych   Anxiety     TIA   GI/Hepatic   Endo/Other  diabetes  Ozempic last dose (9/7) per patient on exam.  Renal/GU Renal disease     Musculoskeletal   Abdominal   Peds  Hematology   Anesthesia Other Findings   Reproductive/Obstetrics                              Anesthesia Physical Anesthesia Plan  ASA: 3  Anesthesia Plan: General   Post-op Pain Management:    Induction: Intravenous  PONV Risk Score and Plan: 1 and Ondansetron  and Dexamethasone   Airway Management Planned:   Additional Equipment: Arterial line  Intra-op Plan:   Post-operative  Plan:   Informed Consent:   Plan Discussed with: CRNA and Surgeon  Anesthesia Plan Comments: (PAT note by Lynwood Hope, PA-C: 53 year old male follows cardiology for history of chronic systolic CHF s/p [employer] dual-chamber ICD implanted on 05/2019 and Barostim placement 12/2020 (generator change out 12/2023), HTN.  Echo 11/2022 showed LVEF 20 to 25%, grade 1 DD, mild mitral regurgitation.  Patient recently underwent Barostim generator change out on 01/14/2024.  Following this, he developed discomfort along the course of the lead.  He was seen in follow-up by Jodie Passey, PA-C on 03/07/2024 and case was discussed with Dr. Inocencio and Dr. Serene.  Ultimately, Barostim pocket revision was recommended.  Other pertinent history includes CKD 3, non-insulin -dependent DM2 (A1c 7.1 on 12/07/2023).  He will need day of surgery labs and evaluation.  TTE 12/10/2022 (Care Everywhere): Summary  1. Overall left ventricular ejection fraction is estimated at 20 to 25%.  2. Severely decreased global left ventricular systolic function.  3. (Grade 1) Mildly abnormal left ventricular diastolic filling.  4. Mild concentric left ventricular hypertrophy.  5. Mild to moderately increased left ventricular internal cavity size.  6. Mild mitral valve regurgitation.  7. Mild to moderate mitral annular calcification.   )         Anesthesia Quick Evaluation

## 2024-03-15 NOTE — Progress Notes (Signed)
 SDW call  Patient was given pre-op  instructions over the phone. Patient verbalized understanding of instructions provided.     PCP - Dr. Oneil Robins Cardiologist - Dr. Lynwood Rakers Cardiologist: [facility] for ICD EP Cardiologist: Dr. Soyla Norton Pulmonary:    PPM/ICD - [facility] Device Orders - Requested from [facility] Rep Notified - Yes, Magdalene will be here   Chest x-ray - 03/07/2024 EKG -  01/13/2024 Stress Test -07/19/2017, CE ECHO - 01/21/22 Cardiac Cath - denies  Sleep Study/sleep apnea/CPAP: denies  Type II diabetic. Last A1C 8.2 in 2024, CE Fasting Blood sugar range: 110-140 How often check sugars: daily  GLP1: Ozempic, states last dose 03/13/2024   Blood Thinner Instructions: denies Aspirin  Instructions:denies   ERAS Protcol - NPO   Anesthesia review: Yes.  HTN, CHF, DM, CKD, cardiomyopathy, ICD   Patient denies shortness of breath, fever, cough and chest pain over the phone call  Your procedure is scheduled on Wednesday March 16, 2024  Report to [facility] Main Entrance A at  0600  A.M., then check in with the Admitting office.  Call this number if you have problems the morning of surgery:  [phone]   If you have any questions prior to your surgery date call [phone]: Open Monday-Friday 8am-4pm If you experience any cold or flu symptoms such as cough, fever, chills, shortness of breath, etc. between now and your scheduled surgery, please notify us  at the above number    Remember:  Do not eat or drink after midnight the night before your surgery  Take these medicines the morning of surgery with A SIP OF WATER:  Allopurinol , amlodipine , ASA, hydralazine , metoprolol , crestor  As of today, STOP taking any Aleve, Naproxen, Ibuprofen, Motrin, Advil, Goody's, BC's, all herbal medications, fish oil, and all vitamins.

## 2024-03-15 NOTE — Progress Notes (Signed)
 Anesthesia Chart Review: Same day workup  53 year old male follows cardiology for history of chronic systolic CHF s/p [employer] dual-chamber ICD implanted on 05/2019 and Barostim placement 12/2020 (generator change out 12/2023), HTN.  Echo 11/2022 showed LVEF 20 to 25%, grade 1 DD, mild mitral regurgitation.  Patient recently underwent Barostim generator change out on 01/14/2024.  Following this, he developed discomfort along the course of the lead.  He was seen in follow-up by Jodie Passey, PA-C on 03/07/2024 and case was discussed with Dr. Inocencio and Dr. Serene.  Ultimately, Barostim pocket revision was recommended.  Other pertinent history includes CKD 3, non-insulin -dependent DM2 (A1c 7.1 on 12/07/2023).  Patient reports last dose of Ozempic 03/13/2024.  He will need day of surgery labs and evaluation.  TTE 12/10/2022 (Care Everywhere): Summary   1. Overall left ventricular ejection fraction is estimated at 20 to 25%.   2. Severely decreased global left ventricular systolic function.   3. (Grade 1) Mildly abnormal left ventricular diastolic filling.   4. Mild concentric left ventricular hypertrophy.   5. Mild to moderately increased left ventricular internal cavity size.   6. Mild mitral valve regurgitation.   7. Mild to moderate mitral annular calcification.     Phillip Ruiz [facility] Short Stay Center/Anesthesiology Phone [phone] 03/15/2024 1:48 PM

## 2024-03-16 ENCOUNTER — Other Ambulatory Visit: Payer: Self-pay

## 2024-03-16 ENCOUNTER — Encounter (HOSPITAL_COMMUNITY): Payer: Self-pay | Admitting: Surgery

## 2024-03-16 ENCOUNTER — Encounter (HOSPITAL_COMMUNITY)
Admission: RE | Disposition: A | Discharge status: Home / Self Care | Payer: Self-pay | Source: Home / Self Care | Attending: Surgery

## 2024-03-16 ENCOUNTER — Ambulatory Visit (HOSPITAL_BASED_OUTPATIENT_CLINIC_OR_DEPARTMENT_OTHER): Admitting: Physician Assistant

## 2024-03-16 ENCOUNTER — Ambulatory Visit (HOSPITAL_COMMUNITY)
Admission: RE | Admit: 2024-03-16 | Discharge: 2024-03-16 | Disposition: A | Discharge status: Home / Self Care | Attending: Surgery | Admitting: Surgery

## 2024-03-16 ENCOUNTER — Ambulatory Visit (HOSPITAL_COMMUNITY)

## 2024-03-16 ENCOUNTER — Ambulatory Visit (HOSPITAL_COMMUNITY): Admitting: Physician Assistant

## 2024-03-16 ENCOUNTER — Other Ambulatory Visit (HOSPITAL_COMMUNITY): Payer: Self-pay

## 2024-03-16 DIAGNOSIS — I130 Hypertensive heart and chronic kidney disease with heart failure and stage 1 through stage 4 chronic kidney disease, or unspecified chronic kidney disease: Secondary | ICD-10-CM

## 2024-03-16 DIAGNOSIS — I5022 Chronic systolic (congestive) heart failure: Secondary | ICD-10-CM | POA: Insufficient documentation

## 2024-03-16 DIAGNOSIS — T82121A Displacement of cardiac pulse generator (battery), initial encounter: Secondary | ICD-10-CM | POA: Diagnosis present

## 2024-03-16 DIAGNOSIS — T82898A Other specified complication of vascular prosthetic devices, implants and grafts, initial encounter: Secondary | ICD-10-CM | POA: Diagnosis not present

## 2024-03-16 DIAGNOSIS — M9689 Other intraoperative and postprocedural complications and disorders of the musculoskeletal system: Secondary | ICD-10-CM | POA: Diagnosis not present

## 2024-03-16 DIAGNOSIS — N183 Chronic kidney disease, stage 3 unspecified: Secondary | ICD-10-CM | POA: Insufficient documentation

## 2024-03-16 DIAGNOSIS — I509 Heart failure, unspecified: Secondary | ICD-10-CM

## 2024-03-16 DIAGNOSIS — I340 Nonrheumatic mitral (valve) insufficiency: Secondary | ICD-10-CM | POA: Insufficient documentation

## 2024-03-16 DIAGNOSIS — Z7985 Long-term (current) use of injectable non-insulin antidiabetic drugs: Secondary | ICD-10-CM | POA: Diagnosis not present

## 2024-03-16 DIAGNOSIS — T82528A Displacement of other cardiac and vascular devices and implants, initial encounter: Secondary | ICD-10-CM | POA: Diagnosis not present

## 2024-03-16 DIAGNOSIS — Z95810 Presence of automatic (implantable) cardiac defibrillator: Secondary | ICD-10-CM | POA: Insufficient documentation

## 2024-03-16 DIAGNOSIS — I5042 Chronic combined systolic (congestive) and diastolic (congestive) heart failure: Secondary | ICD-10-CM | POA: Diagnosis not present

## 2024-03-16 DIAGNOSIS — Y713 Surgical instruments, materials and cardiovascular devices (including sutures) associated with adverse incidents: Secondary | ICD-10-CM | POA: Insufficient documentation

## 2024-03-16 DIAGNOSIS — Z95818 Presence of other cardiac implants and grafts: Secondary | ICD-10-CM | POA: Diagnosis not present

## 2024-03-16 DIAGNOSIS — E1122 Type 2 diabetes mellitus with diabetic chronic kidney disease: Secondary | ICD-10-CM | POA: Diagnosis not present

## 2024-03-16 HISTORY — DX: Cerebral infarction, unspecified: I63.9

## 2024-03-16 HISTORY — DX: Headache, unspecified: R51.9

## 2024-03-16 HISTORY — DX: Presence of automatic (implantable) cardiac defibrillator: Z95.810

## 2024-03-16 LAB — CBC
HCT: 42.7 % (ref 39.0–52.0)
Hemoglobin: 13.7 g/dL (ref 13.0–17.0)
MCH: 29.4 pg (ref 26.0–34.0)
MCHC: 32.1 g/dL (ref 30.0–36.0)
MCV: 91.6 fL (ref 80.0–100.0)
Platelets: 304 K/uL (ref 150–400)
RBC: 4.66 MIL/uL (ref 4.22–5.81)
RDW: 14 % (ref 11.5–15.5)
WBC: 4.9 K/uL (ref 4.0–10.5)
nRBC: 0 % (ref 0.0–0.2)

## 2024-03-16 LAB — BASIC METABOLIC PANEL WITH GFR
Anion gap: 11 (ref 5–15)
BUN: 13 mg/dL (ref 6–20)
CO2: 24 mmol/L (ref 22–32)
Calcium: 9.9 mg/dL (ref 8.9–10.3)
Chloride: 104 mmol/L (ref 98–111)
Creatinine, Ser: 1.68 mg/dL — ABNORMAL HIGH (ref 0.61–1.24)
GFR, Estimated: 48 mL/min — ABNORMAL LOW (ref >60)
Glucose, Bld: 125 mg/dL — ABNORMAL HIGH (ref 70–99)
Potassium: 3.6 mmol/L (ref 3.5–5.1)
Sodium: 139 mmol/L (ref 135–145)

## 2024-03-16 LAB — GLUCOSE, CAPILLARY
Glucose-Capillary: 129 mg/dL — ABNORMAL HIGH (ref 70–99)
Glucose-Capillary: 247 mg/dL — ABNORMAL HIGH (ref 70–99)
Glucose-Capillary: 203 mg/dL — ABNORMAL HIGH (ref 70–99)

## 2024-03-16 LAB — TYPE AND SCREEN
ABO/RH(D): O POS
Antibody Screen: NEGATIVE

## 2024-03-16 LAB — SURGICAL PCR SCREEN
MRSA, PCR: NEGATIVE
Staphylococcus aureus: NEGATIVE

## 2024-03-16 SURGERY — REVISION, PROCEDURE INVOLVING CAROTID SINUS STIMULATOR ELECTRODE LEAD
Anesthesia: General | Site: Chest | Laterality: Right

## 2024-03-16 MED ORDER — DEXAMETHASONE SODIUM PHOSPHATE 10 MG/ML IJ SOLN
INTRAMUSCULAR | Status: DC | PRN
  Administered 2024-03-16: 10 mg via INTRAVENOUS

## 2024-03-16 MED ORDER — PROPOFOL 10 MG/ML IV BOLUS
INTRAVENOUS | Status: DC | PRN
  Administered 2024-03-16: 50 mg via INTRAVENOUS
  Administered 2024-03-16: 10 mg via INTRAVENOUS
  Administered 2024-03-16 (×4): 20 mg via INTRAVENOUS

## 2024-03-16 MED ORDER — LACTATED RINGERS IV SOLN: INTRAVENOUS | Status: DC

## 2024-03-16 MED ORDER — VASOPRESSIN 20 UNIT/ML IV SOLN
INTRAVENOUS | Status: AC
  Filled 2024-03-16: qty 1

## 2024-03-16 MED ORDER — FENTANYL CITRATE (PF) 250 MCG/5ML IJ SOLN
INTRAMUSCULAR | Status: AC
  Filled 2024-03-16: qty 5

## 2024-03-16 MED ORDER — INSULIN ASPART 100 UNIT/ML IJ SOLN: 0.0000 [IU] | INTRAMUSCULAR | Status: DC | PRN

## 2024-03-16 MED ORDER — ROCURONIUM BROMIDE 10 MG/ML (PF) SYRINGE
PREFILLED_SYRINGE | INTRAVENOUS | Status: AC
  Filled 2024-03-16: qty 10

## 2024-03-16 MED ORDER — ONDANSETRON HCL 4 MG/2ML IJ SOLN
INTRAMUSCULAR | Status: DC | PRN
  Administered 2024-03-16: 4 mg via INTRAVENOUS

## 2024-03-16 MED ORDER — MIDAZOLAM HCL 2 MG/2ML IJ SOLN
INTRAMUSCULAR | Status: DC | PRN
  Administered 2024-03-16 (×2): 1 mg via INTRAVENOUS

## 2024-03-16 MED ORDER — DROPERIDOL 2.5 MG/ML IJ SOLN: 0.6250 mg | Freq: Once | INTRAMUSCULAR | Status: DC | PRN

## 2024-03-16 MED ORDER — CEFAZOLIN SODIUM-DEXTROSE 2-4 GM/100ML-% IV SOLN
2.0000 g | INTRAVENOUS | Status: AC
Start: 2024-03-16 — End: 2024-03-16
  Administered 2024-03-16: 2 g via INTRAVENOUS
  Filled 2024-03-16: qty 100

## 2024-03-16 MED ORDER — VASOPRESSIN 20 UNITS/100 ML INFUSION FOR SHOCK
INTRAVENOUS | Status: DC | PRN
  Administered 2024-03-16: .03 [IU]/min via INTRAVENOUS

## 2024-03-16 MED ORDER — CLEVIDIPINE BUTYRATE 0.5 MG/ML IV EMUL
INTRAVENOUS | Status: AC
  Filled 2024-03-16: qty 50

## 2024-03-16 MED ORDER — 0.9 % SODIUM CHLORIDE (POUR BTL) OPTIME
TOPICAL | Status: DC | PRN
  Administered 2024-03-16: 1000 mL

## 2024-03-16 MED ORDER — EPINEPHRINE HCL 5 MG/250ML IV SOLN IN NS
INTRAVENOUS | Status: AC
  Filled 2024-03-16: qty 250

## 2024-03-16 MED ORDER — VASOPRESSIN 20 UNIT/ML IV SOLN
INTRAVENOUS | Status: DC | PRN
Start: 2024-03-16 — End: 2024-03-16
  Administered 2024-03-16 (×4): 1 [IU] via INTRAVENOUS

## 2024-03-16 MED ORDER — MIDAZOLAM HCL 2 MG/2ML IJ SOLN
INTRAMUSCULAR | Status: AC
  Filled 2024-03-16: qty 2

## 2024-03-16 MED ORDER — ONDANSETRON HCL 4 MG/2ML IJ SOLN
INTRAMUSCULAR | Status: AC
  Filled 2024-03-16: qty 2

## 2024-03-16 MED ORDER — ROCURONIUM BROMIDE 10 MG/ML (PF) SYRINGE
PREFILLED_SYRINGE | INTRAVENOUS | Status: DC | PRN
  Administered 2024-03-16: 10 mg via INTRAVENOUS
  Administered 2024-03-16: 50 mg via INTRAVENOUS

## 2024-03-16 MED ORDER — PROPOFOL 10 MG/ML IV BOLUS
INTRAVENOUS | Status: AC
  Filled 2024-03-16: qty 20

## 2024-03-16 MED ORDER — EPINEPHRINE HCL 5 MG/250ML IV SOLN IN NS
INTRAVENOUS | Status: DC | PRN
  Administered 2024-03-16 (×2): 2 ug/min via INTRAVENOUS

## 2024-03-16 MED ORDER — OXYCODONE HCL 5 MG/5ML PO SOLN: 5.0000 mg | Freq: Once | ORAL | Status: DC | PRN

## 2024-03-16 MED ORDER — CHLORHEXIDINE GLUCONATE 4 % EX SOLN: 60.0000 mL | Freq: Once | CUTANEOUS | Status: DC

## 2024-03-16 MED ORDER — EPINEPHRINE 1 MG/10ML IV SOSY
PREFILLED_SYRINGE | INTRAVENOUS | Status: AC
  Filled 2024-03-16: qty 10

## 2024-03-16 MED ORDER — SODIUM CHLORIDE 0.9 % IV SOLN: INTRAVENOUS | Status: DC

## 2024-03-16 MED ORDER — SODIUM CHLORIDE (PF) 0.9 % IJ SOLN
INTRAMUSCULAR | Status: AC
  Filled 2024-03-16: qty 20

## 2024-03-16 MED ORDER — HYDROCODONE-ACETAMINOPHEN 5-325 MG PO TABS
1.0000 | ORAL_TABLET | Freq: Four times a day (QID) | ORAL | 0 refills | Status: AC | PRN
  Filled 2024-03-16: qty 20, 5d supply, fill #0

## 2024-03-16 MED ORDER — ORAL CARE MOUTH RINSE: 15.0000 mL | Freq: Once | OROMUCOSAL | Status: AC

## 2024-03-16 MED ORDER — INSULIN ASPART 100 UNIT/ML IJ SOLN
2.0000 [IU] | Freq: Once | INTRAMUSCULAR | Status: AC
  Administered 2024-03-16: 2 [IU] via SUBCUTANEOUS

## 2024-03-16 MED ORDER — OXYCODONE HCL 5 MG PO TABS: 5.0000 mg | ORAL_TABLET | Freq: Once | ORAL | Status: DC | PRN

## 2024-03-16 MED ORDER — SODIUM CHLORIDE 0.9 % IV SOLN
0.1000 ug/kg/min | Freq: Once | INTRAVENOUS | Status: AC
  Administered 2024-03-16: .1 ug/kg/min via INTRAVENOUS
  Filled 2024-03-16: qty 2000

## 2024-03-16 MED ORDER — VASHE WOUND IRRIGATION OPTIME
TOPICAL | Status: DC | PRN
  Administered 2024-03-16: 34 [oz_av]

## 2024-03-16 MED ORDER — ONDANSETRON HCL 4 MG/2ML IJ SOLN
4.0000 mg | Freq: Once | INTRAMUSCULAR | Status: AC | PRN
  Administered 2024-03-16: 4 mg via INTRAVENOUS

## 2024-03-16 MED ORDER — LIDOCAINE 2% (20 MG/ML) 5 ML SYRINGE
INTRAMUSCULAR | Status: AC
  Filled 2024-03-16: qty 5

## 2024-03-16 MED ORDER — CHLORHEXIDINE GLUCONATE 0.12 % MT SOLN
15.0000 mL | Freq: Once | OROMUCOSAL | Status: AC
  Administered 2024-03-16: 15 mL via OROMUCOSAL
  Filled 2024-03-16: qty 15

## 2024-03-16 MED ORDER — LACTATED RINGERS IV SOLN: INTRAVENOUS | Status: DC | PRN

## 2024-03-16 MED ORDER — SUGAMMADEX SODIUM 200 MG/2ML IV SOLN
INTRAVENOUS | Status: DC | PRN
  Administered 2024-03-16: 200 mg via INTRAVENOUS

## 2024-03-16 MED ORDER — FENTANYL CITRATE (PF) 250 MCG/5ML IJ SOLN
INTRAMUSCULAR | Status: DC | PRN
  Administered 2024-03-16: 100 ug via INTRAVENOUS

## 2024-03-16 MED ORDER — FENTANYL CITRATE (PF) 100 MCG/2ML IJ SOLN: 25.0000 ug | INTRAMUSCULAR | Status: DC | PRN

## 2024-03-16 SURGICAL SUPPLY — 36 items
BAG COUNTER SPONGE SURGICOUNT (BAG) ×1 IMPLANT
CANISTER SUCTION 3000ML PPV (SUCTIONS) ×1 IMPLANT
CLEANSER WND VASHE INSTL 34OZ (WOUND CARE) IMPLANT
CLIP TI MEDIUM 6 (CLIP) IMPLANT
CLIP TI WIDE RED SMALL 6 (CLIP) IMPLANT
COVER PROBE W GEL 5X96 (DRAPES) ×1 IMPLANT
DERMABOND ADVANCED .7 DNX12 (GAUZE/BANDAGES/DRESSINGS) ×1 IMPLANT
ELECTRODE REM PT RTRN 9FT ADLT (ELECTROSURGICAL) ×1 IMPLANT
ELECTRODE SOLI GEL RDN PROPADZ (MISCELLANEOUS) IMPLANT
GLOVE BIOGEL PI IND STRL 8 (GLOVE) ×1 IMPLANT
GLOVE SURG SS PI 7.5 STRL IVOR (GLOVE) ×1 IMPLANT
GOWN STRL REUS W/ TWL LRG LVL3 (GOWN DISPOSABLE) ×2 IMPLANT
GOWN STRL REUS W/ TWL XL LVL3 (GOWN DISPOSABLE) ×1 IMPLANT
HEMOSTAT SNOW SURGICEL 2X4 (HEMOSTASIS) IMPLANT
KIT BASIN OR (CUSTOM PROCEDURE TRAY) ×1 IMPLANT
KIT TURNOVER KIT B (KITS) ×1 IMPLANT
MARKER SKIN DUAL TIP RULER LAB (MISCELLANEOUS) ×1 IMPLANT
NDL 18GX1X1/2 (RX/OR ONLY) (NEEDLE) ×1 IMPLANT
NEEDLE 18GX1X1/2 (RX/OR ONLY) (NEEDLE) ×1 IMPLANT
NS IRRIG 1000ML POUR BTL (IV SOLUTION) ×2 IMPLANT
PACK CAROTID (CUSTOM PROCEDURE TRAY) ×1 IMPLANT
POSITIONER HEAD DONUT 9IN (MISCELLANEOUS) ×1 IMPLANT
POUCH AIGIS-R ANTIBACT ICD LRG (Mesh General) IMPLANT
SURGIFLO W/THROMBIN 8M KIT (HEMOSTASIS) IMPLANT
SUT ETHIBOND CT1 BRD #0 30IN (SUTURE) ×2 IMPLANT
SUT ETHILON 3 0 PS 1 (SUTURE) IMPLANT
SUT PROLENE 6 0 BV (SUTURE) ×8 IMPLANT
SUT SILK 0 FSL (SUTURE) IMPLANT
SUT VIC AB 3-0 SH 27X BRD (SUTURE) ×2 IMPLANT
SUT VIC AB 4-0 PS2 18 (SUTURE) ×2 IMPLANT
SWAB COLLECTION DEVICE MRSA (MISCELLANEOUS) IMPLANT
SWAB CULTURE ESWAB REG 1ML (MISCELLANEOUS) IMPLANT
SYR 5ML LL (SYRINGE) ×1 IMPLANT
SYR BULB IRRIG 60ML STRL (SYRINGE) ×1 IMPLANT
TOWEL GREEN STERILE (TOWEL DISPOSABLE) ×1 IMPLANT
WATER STERILE IRR 1000ML POUR (IV SOLUTION) ×1 IMPLANT

## 2024-03-16 NOTE — Anesthesia Procedure Notes (Signed)
 Procedure Name: Intubation Date/Time: 03/16/2024 9:01 AM  Performed by: [name], CRNAPre-anesthesia Checklist: Patient identified, Emergency Drugs available, Suction available and Patient being monitored Patient Re-evaluated:Patient Re-evaluated prior to induction Oxygen Delivery Method: Circle System Utilized Preoxygenation: Pre-oxygenation with 100% oxygen Induction Type: IV induction Ventilation: Mask ventilation without difficulty Laryngoscope Size: Glidescope and 4 Grade View: Grade I Tube type: Oral Tube size: 7.5 mm Number of attempts: 1 Airway Equipment and Method: Stylet Placement Confirmation: ETT inserted through vocal cords under direct vision, positive ETCO2 and breath sounds checked- equal and bilateral Secured at: 22 cm Tube secured with: Tape Dental Injury: Teeth and Oropharynx as per pre-operative assessment

## 2024-03-16 NOTE — Progress Notes (Signed)
 The patient has a Environmental manager ICD. Called Joey with [employer] and gave him patient's location in Short Stay. Joey stated he will arrive shortly.

## 2024-03-16 NOTE — Anesthesia Procedure Notes (Signed)
 Arterial Line Insertion Start/End9/17/2025 7:45 AM, 03/16/2024 7:50 AM Performed by: Tommas Cena SQUIBB, RN  Patient location: Pre-op . Preanesthetic checklist: patient identified, IV checked, site marked, risks and benefits discussed, surgical consent, monitors and equipment checked, pre-op  evaluation, timeout performed and anesthesia consent Lidocaine  1% used for infiltration Left, radial was placed Catheter size: 20 G Hand hygiene performed  and maximum sterile barriers used  Allen's test indicative of satisfactory collateral circulation Attempts: 1 Procedure performed without using ultrasound guided technique. Following insertion, dressing applied and Biopatch. Post procedure assessment: normal  Patient tolerated the procedure well with no immediate complications. Additional procedure comments: Preformed by SRNA, CRNA and MDA present for procedure.

## 2024-03-16 NOTE — Transfer of Care (Signed)
 Immediate Anesthesia Transfer of Care Note  Patient: Phillip Ruiz  Procedure(s) Performed: REVISION, PROCEDURE INVOLVING CAROTID SINUS STIMULATOR ELECTRODE LEAD (Right: Chest)  Patient Location: PACU  Anesthesia Type:General  Level of Consciousness: awake, drowsy, and patient cooperative  Airway & Oxygen Therapy: Patient Spontanous Breathing and Patient connected to nasal cannula oxygen  Post-op Assessment: Report given to RN and Post -op Vital signs reviewed and stable  Post vital signs: Reviewed and stable  Last Vitals:  Vitals Value Taken Time  BP 163/96 03/16/24 10:31  Temp    Pulse 82 03/16/24 10:36  Resp 14 03/16/24 10:36  SpO2 93 % 03/16/24 10:36  Vitals shown include unfiled device data.  Last Pain:  Vitals:   03/16/24 0657  TempSrc:   PainSc: 0-No pain      Patients Stated Pain Goal: 0 (03/16/24 0657)  Complications: No notable events documented.

## 2024-03-16 NOTE — H&P (Signed)
   Patient name: Phillip Ruiz MRN: [id number] DOB: 09-30-1970 Sex: male    HISTORY OF PRESENT ILLNESS:   Phillip Ruiz is a 53 y.o. male who is s/p right sided Barostim via Museum/gallery curator.  He recently had a generator change and since then he has not been able to move his neck down.  We have gotten x-rays and nothing appears to be abnormal other than his inability to move his neck.  CURRENT MEDICATIONS:    Current Facility-Administered Medications  Medication Dose Route Frequency Provider Last Rate Last Admin   0.9 %  sodium chloride  infusion   Intravenous Continuous [name], MD       ceFAZolin  (ANCEF ) IVPB 2g/100 mL premix  2 g Intravenous 30 min Pre-Op  [name], MD       chlorhexidine  (HIBICLENS ) 4 % liquid 4 Application  60 mL Topical Once [name], MD       And   [START ON 03/17/2024] chlorhexidine  (HIBICLENS ) 4 % liquid 4 Application  60 mL Topical Once [name], MD       insulin  aspart (novoLOG ) injection 0-14 Units  0-14 Units Subcutaneous Q2H PRN [name], MD       lactated ringers  infusion   Intravenous Continuous [name], MD       remifentanil  (ULTIVA ) 2 mg in 100 mL normal saline (20 mcg/mL) Optime  0.1 mcg/kg/min Intravenous Once [name], CRNA        REVIEW OF SYSTEMS:   [X]  denotes positive finding, [ ]  denotes negative finding Cardiac  Comments:  Chest pain or chest pressure:    Shortness of breath upon exertion:    Short of breath when lying flat:    Irregular heart rhythm:    Constitutional    Fever or chills:      PHYSICAL EXAM:   Vitals:   03/15/24 1029 03/16/24 0634  BP:  (!) 162/99  Pulse:  72  Resp:  20  Temp:  98.7 F (37.1 C)  TempSrc:  Oral  SpO2:  98%  Weight: 84.4 kg 84.4 kg  Height: 5' 8 (1.727 m) 5' 8 (1.727 m)    GENERAL: The patient is a well-nourished male, in no acute distress. The vital signs are documented above. CARDIOVASCULAR:  There is a regular rate and rhythm. PULMONARY: Non-labored respirations   STUDIES:   X-rays show Barostim device is in good position   MEDICAL ISSUES:   Because of his inability to move his neck, we have elected to revise his device.  I discussed starting with a neck incision and trying to bring up some slack from the wiring.  Potentially I may have to revise the pocket as well.  He understands all this and wishes to proceed.  Malvina Serene CLORE, MD, FACS [facility] [phone] Pager [phone]

## 2024-03-16 NOTE — Op Note (Signed)
    Patient name: Phillip Ruiz MRN: [id number] DOB: February 23, 1971 Sex: male  03/16/2024 Pre-operative Diagnosis: CHF with Barostim malposition Post-operative diagnosis:  Same Surgeon:  Malvina New Assistants:  Phillip Sender, PA Procedure:   #1:  Revision of right sided Barostim   #2:  Exposure of device via right neck incision   #3:  re-opening of right chest wall generator incision    #4: Placement of antibiotic impregnated pouch for Barostim generator Anesthesia:  General Blood Loss:  minimal Specimens:  cultures of clear fluid around the generator were sent  Findings: I made a small incision in the right neck and dissected out the mesh stab.  This was fully mobilized as well as the strain relief loop.  I then opened the generator incision.  The cord had wrapped around and going posterior to the generator and was tethered and scar tissue.  The cord was fully mobilized and all tension was removed  Indications: This is a 53 year old gentleman who has previously undergone Barostim implant via Batwire technique.  He recently had a generator change and since that time has been having difficulty extending his neck.  He comes in today for revision  Procedure:  The patient was identified in the holding area and taken to [facility] OR ROOM 11  The patient was then placed supine on the table. general anesthesia was administered.  The patient was prepped and draped in the usual sterile fashion.  A time out was called and antibiotics were administered.  A PA was necessary to explain the procedure and assist with technical details.  He helped with exposure by providing suction and retraction.  He helped with closure.  Ultrasound was used to identify the location of the strain relief tab in the lower right neck.  A oblique incision was made over top of this area.  Cautery and sharp dissection were used to circumferentially expose the tubing as well as the mesh tap which was fully mobilized.  The previously placed  tacking Prolene sutures were removed.  I then dissected out the strain relief loop.  The device appeared tension-free and this level.  Therefore elected to open the right chest wall incision.  This was done with a 10 blade.  Cautery was used divide subcutaneous tissue until I reached the pocket for the generator.  Upon opening the scar tissue, there was clear fluid that was cultured.  I then proceeded to dissect out the generator.  I identified the tubing that came out of the device and then went anterior to the generator and then wraparound posterior.  On the posterior side it was tethered by scar tissue.  I remove the band of scar tissue which freed up the device.  I removed the device from the pocket and made sure that there was no tension.  I then irrigated the neck incision and pocket incision with Vashe solution.  I used a antibiotic impregnated pouch for the generator which was placed back into the pocket and sutured down with two 2-0 Ethibond sutures.  Next, both incisions were closed with a deep layer of 3-0 Vicryl in a subcutaneous layer of 4-0 Vicryl and Dermabond.  There were no immediate complications.   Disposition: To PACU stable.   ALONSO Malvina New, M.D., [facility] [place] Office: [phone] Pager:  [phone]

## 2024-03-16 NOTE — Anesthesia Postprocedure Evaluation (Signed)
 Anesthesia Post Note  Patient: Phillip Ruiz  Procedure(s) Performed: REVISION, PROCEDURE INVOLVING CAROTID SINUS STIMULATOR ELECTRODE LEAD (Right: Chest)     Anesthesia Type: General Anesthetic complications: no   No notable events documented.  Last Vitals:  Vitals:   03/16/24 1215 03/16/24 1220  BP:  (!) 149/97  Pulse:  89  Resp: 10 16  Temp:  36.8 C  SpO2:  93%    Last Pain:  Vitals:   03/16/24 1220  TempSrc:   PainSc: 0-No pain                 Lauraine KATHEE [place]

## 2024-03-21 LAB — AEROBIC/ANAEROBIC CULTURE W GRAM STAIN (SURGICAL/DEEP WOUND)
Culture: NO GROWTH
Gram Stain: NONE SEEN

## 2024-04-04 ENCOUNTER — Ambulatory Visit: Attending: Surgery | Admitting: Physician Assistant

## 2024-04-04 ENCOUNTER — Other Ambulatory Visit: Payer: Self-pay

## 2024-04-04 ENCOUNTER — Encounter: Payer: Self-pay | Admitting: Physician Assistant

## 2024-04-04 ENCOUNTER — Ambulatory Visit
Admission: RE | Admit: 2024-04-04 | Discharge: 2024-04-04 | Disposition: A | Discharge status: Home / Self Care | Source: Ambulatory Visit | Attending: Surgery | Admitting: Surgery

## 2024-04-04 ENCOUNTER — Other Ambulatory Visit (HOSPITAL_COMMUNITY): Payer: Self-pay

## 2024-04-04 ENCOUNTER — Ambulatory Visit: Attending: Cardiology | Admitting: Physician Assistant

## 2024-04-04 VITALS — BP 144/78 | HR 82 | Ht 68.0 in | Wt 187.2 lb

## 2024-04-04 VITALS — BP 153/115 | HR 87 | Temp 98.6°F | Wt 186.5 lb

## 2024-04-04 DIAGNOSIS — I5022 Chronic systolic (congestive) heart failure: Secondary | ICD-10-CM

## 2024-04-04 DIAGNOSIS — Z95810 Presence of automatic (implantable) cardiac defibrillator: Secondary | ICD-10-CM | POA: Diagnosis not present

## 2024-04-04 DIAGNOSIS — I10 Essential (primary) hypertension: Secondary | ICD-10-CM | POA: Diagnosis not present

## 2024-04-04 MED ORDER — METOPROLOL SUCCINATE ER 100 MG PO TB24
100.0000 mg | ORAL_TABLET | Freq: Every day | ORAL | 0 refills | Status: AC
  Filled 2024-04-04: qty 30, 30d supply, fill #0

## 2024-04-04 NOTE — Patient Instructions (Signed)
 Medication Instructions:   Your physician recommends that you continue on your current medications as directed. Please refer to the Current Medication list given to you today.   *If you need a refill on your cardiac medications before your next appointment, please call your pharmacy*   Lab Work:  NONE ORDERED  TODAY     If you have labs (blood work) drawn today and your tests are completely normal, you will receive your results only by: MyChart Message (if you have MyChart) OR A paper copy in the mail If you have any lab test that is abnormal or we need to change your treatment, we will call you to review the results.    Testing/Procedures: NONE ORDERED  TODAY    Follow-Up: At [facility], you and your health needs are our priority.  As part of our continuing mission to provide you with exceptional heart care, our providers are all part of one team.  This team includes your primary Cardiologist (physician) and Advanced Practice Providers or APPs (Physician Assistants and Nurse Practitioners) who all work together to provide you with the care you need, when you need it.   Your next appointment:   AS SCHEDULED      We recommend signing up for the patient portal called "MyChart".  Sign up information is provided on this After Visit Summary.  MyChart is used to connect with patients for Virtual Visits (Telemedicine).  Patients are able to view lab/test results, encounter notes, upcoming appointments, etc.  Non-urgent messages can be sent to your provider as well.   To learn more about what you can do with MyChart, go to [email or web address].   Other Instructions

## 2024-04-04 NOTE — Progress Notes (Signed)
 Cardiology Office Note:  .   Date:  04/04/2024  ID:  Phillip Ruiz, DOB Aug 14, 1970, MRN [id number] PCP: Henriette Anes, DO  [facility] Providers Cardiologist:  Dr. Arnell [facility]) Electrophysiologist:  Will Gladis Norton, MD {  History of Present Illness: Phillip   SHAAN Ruiz is a 53 y.o. male w/PMHx of  DCM, HTN, DM ICD  He saw Dr. Arnell 11/10/23, struggling w/L eye ptosis, cardiac-wise, no changes were made described poor medication compliances  Had a brief hospitalization with volume OL Admitted 01/13/24 diuresed Barostim gen change 01/14/24 Discharged 01/15/24  He saw Jodie 03/07/24, c/o neck pain.stiffness post gen change, in d/w Dr. Serene, suspected loss of slack on the lead  Underwent device revision 03/16/24  At his visit today with [facility], saw both PA as well as Dr. Serene, c/w neck stiffness/discomfort and limited ROM, reported being off his BP meds Initial vitals 153/115, 87bpm > 178/100, 43bpm pt seen with Dr. Serene. Pt unable to keep head lifted and unable to keep eyes open. His blood pressure is significantly elevated and he is not taking any antihypertensives and request refill on Toprol , however, his HR today ranges from 40's-80's most likely due to [facility] device. Given this, I am trying to get him an appt upstairs with cardiology to manage this   Today's visit is scheduled as an urgent work in ROS:   He is accompanied by his daughter He reports that he feels just fine, but can not hold up his head or keep his eys open. He CAN lift up his head but can not keep it up, and he CAN open his eyes, but can not keep them open This has been ongoing for ~ 7months Reports seeing his PMD, having and eye exam as well Has been seen here in the [facility], reporting today that Dr. Serene did not think his symptoms were 2/2 the barostim They discussed extraction, but worried he would decline quickly from a heart failure perspective without it.  His BP  was very high at his [facility] appointment, has been without any of his BP/cardiac meds for some weeks. Unclear, seems no refills, but doesn't seem like he has reached out to his local cardiologist either. They also got HRs 40's-80s and asked for an urgent work in to be seen up here  He denies any CP, palpitations or cardiac awareness No near syncope or syncope Denies head ache No neck pain No focal neuro c/o otherwise  His daughter has urged ER visit over the last few months for his symptoms, but he has declined.    Device information [employer] dual chamber ICD implanted 05/10/2019 Followed by Dr. Arnell Snooks Batwire implanted 2022 > gen change 01/14/24  Studies Reviewed: Phillip    EKG done today and reviewed by myself:  SR PACs, 82bpm  DEVICE interrogation done today and reviewed by myself Battery and lead measurements are good No arrhythmias AS/VS presenting 70's-80's with frequent PACs HR histograms are good  Risk Assessment/Calculations:    Physical Exam:   VS:  There were no vitals taken for this visit.   Wt Readings from Last 3 Encounters:  04/04/24 186 lb 8 oz (84.6 kg)  03/16/24 186 lb 1.1 oz (84.4 kg)  03/07/24 189 lb (85.7 kg)    GEN: Well nourished, well developed in no acute distress, skin is warm, dry NECK: No JVD; No carotid bruits Seated with his chin to his chest, eyes closed He is able to lift his head,  open his eyes, but not hold either CARDIAC: RRR, + extrasystoles, no murmurs, rubs, gallops RESPIRATORY:  CTA b/l without rales, wheezing or rhonchi  ABDOMEN: Soft, non-tender, non-distended EXTREMITIES: No edema; No deformity  No other focal defecits/neuro findings  ICD site: is stable, no thinning, fluctuation, tethering Barostim site: healing well, no signs of infection  ASSESSMENT AND PLAN: .    ICD Followed elsewhere Normal device function Suspect bradycardia associated with PACs/irregularity to his pulse/tech  HTN Recheck by myself Left:  160/90 Right: 158/90  Long discussion of importance of his meds. I have authorized a single fill of his Toprol  downstairs at out pharmacy and urged he call his attending/local cardiologist for the rest of his medications He/his daughter said they would/  3.  Head/neck/eyes Unusual and unknown etiology Sounds like Dr. Serene did not think associated with his barostim lead/device Given fairly chronic now for about 1mo, did not think ER was necessary, but if they wanted to go, not unreasonable either. They have been ordered imaging and neuro eval via Dr. Serene Otherwise > PMD to evaluate further  4. Chronic CHF (systolic) Appears euvolemic Heart logic is 8 (improved of late)   Dispo: as scheduled, sooner if needed  Signed, Charlies Macario Arthur, PA-C

## 2024-04-04 NOTE — Progress Notes (Signed)
 POST OPERATIVE OFFICE NOTE    CC:  F/u for surgery  HPI:  This is a 53 y.o. male who is s/p Barostim placement 01/24/2021  by Dr. Serene.   He most recently underwent revision of right sided Barostim and placement of antibiotic inpregnated pouch for Barostim generator on 03/16/2024 by Dr. Serene and this was done as he had recently had the generator changes and since that time, he was having difficulty extending his neck.    Pt returns today for follow up and he is here with niece and seen with Dr. Serene.  Pt states he is still not able to move his neck and that if he lifts his head up, it will just fall and is having trouble opening his eyes.  His niece states he is not taking any of his blood pressure medicine.  Pt states they told him to stop taking the norvasc .  He has not taken hydralazine .  He states he does not have any blood pressure medicine and requests Toprol  refill.    Pt tells Dr. Serene he did not get any better after the last surgery.     No Known Allergies  Current Outpatient Medications  Medication Sig Dispense Refill   allopurinol  (ZYLOPRIM ) 100 MG tablet Take 200 mg by mouth daily.     amLODipine  (NORVASC ) 10 MG tablet Take 10 mg by mouth daily.     aspirin  EC 81 MG tablet Take 1 tablet (81 mg total) by mouth daily as needed (for heart).     calcitRIOL (ROCALTROL) 0.5 MCG capsule Take 0.5 mcg by mouth daily.     hydrALAZINE  (APRESOLINE ) 25 MG tablet Take 25 mg by mouth 2 (two) times daily.     HYDROcodone -acetaminophen  (NORCO/VICODIN) 5-325 MG tablet Take 1 tablet by mouth every 6 (six) hours as needed for moderate pain (pain score 4-6). 20 tablet 0   metoprolol  succinate (TOPROL -XL) 100 MG 24 hr tablet Take 1 tablet (100 mg total) by mouth daily.     Multiple Vitamin (MULTIVITAMIN PO) Take 1 tablet by mouth daily.     rosuvastatin (CRESTOR) 10 MG tablet Take 10 mg by mouth daily.     sacubitril -valsartan  (ENTRESTO ) 49-51 MG Take 1 tablet by mouth 2 (two) times  daily.     Semaglutide, 2 MG/DOSE, (OZEMPIC, 2 MG/DOSE,) 8 MG/3ML SOPN Inject 2 mg into the skin every Sunday.     sildenafil (VIAGRA) 100 MG tablet Take 100 mg by mouth daily as needed for erectile dysfunction.     torsemide (DEMADEX) 20 MG tablet Take 20 mg by mouth 2 (two) times daily.     No current facility-administered medications for this visit.     ROS:  See HPI  Physical Exam:  Today's Vitals   04/04/24 1320 04/04/24 1325  BP: (!) 178/100 (!) 153/115  Pulse: (!) 43 87  Temp: 98.6 F (37 C)   TempSrc: Temporal   Weight: 186 lb 8 oz (84.6 kg)   PainLoc: Neck    Body mass index is 28.36 kg/m.   Incision:  well healed    Assessment/Plan:  This is a 53 y.o. male who is s/p:  Barostim placement 01/24/2021  by Dr. Serene.   He most recently underwent revision of right sided Barostim and placement of antibiotic inpregnated pouch for Barostim generator on 03/16/2024 by Dr. Serene and this was done as he had recently had the generator changes and since that time, he was having difficulty extending his neck.    -pt  seen with Dr. Serene.  Pt unable to keep head lifted and unable to keep eyes open.  His blood pressure is significantly elevated and he is not taking any antihypertensives and request refill on Toprol , however, his HR today ranges from 40's-80's most likely due to [facility] device.  Given this, I am trying to get him an appt upstairs with cardiology to manage this.   -Dr. Serene will see pt back in a week and he would like for him to get a chest xray (right sided with concentration over device and right neck area). Since they live in [place], hopeful to get xray on the day he comes back for his appt next week.   -will also refer to neurology to see if any of his symptoms are neurological.   -if workup negative, Dr. Serene will need to remove device.  He discussed with them that this may or may not help his symptoms and his heart failure symptoms may also worsen.      Lucie Apt, [facility] [phone]   Clinic MD:  Serene

## 2024-04-06 ENCOUNTER — Encounter: Payer: Self-pay | Admitting: Neurology

## 2024-04-11 ENCOUNTER — Ambulatory Visit: Attending: Surgery | Admitting: Surgery

## 2024-04-11 NOTE — Progress Notes (Deleted)
 Patient name: Phillip Ruiz MRN: [id number] DOB: May 25, 1971 Sex: male  REASON FOR VISIT:     Post op  HISTORY OF PRESENT ILLNESS:   Phillip Ruiz is a 53 y.o. male who under went placement of a right sided Barostim device using Batwire technique on 01/24/2021.  He had a generator change on 01/14/2024 by Dr. Inocencio.  Following the generator change he was unable to move his neck down.  X-rays were performed that did not show any significant abnormalities however because of his symptoms I elected to take him to the operating room for exploration which was performed on 03/16/2024.  I found that the cord had wrapped around the battery and was tethered to scar tissue.  I fully mobilized this in the infraclavicular incision and also opened up the right neck and made sure there was no tension here.  He was seen in the office on 04/04/2024 and continued to have issues with his neck.  If he lifts his chin up, it will just fall back down.  He is also having trouble opening his eyes.  He called last week to state that he wanted to have the device taken out.  I asked that he come in for another visit before committing him to explant.  I had also referred him to neurology to see if there is any other explanation.  He has not seen them.  CURRENT MEDICATIONS:    Current Outpatient Medications  Medication Sig Dispense Refill   allopurinol  (ZYLOPRIM ) 100 MG tablet Take 200 mg by mouth daily.     amLODipine  (NORVASC ) 10 MG tablet Take 10 mg by mouth daily.     aspirin  EC 81 MG tablet Take 1 tablet (81 mg total) by mouth daily as needed (for heart).     calcitRIOL (ROCALTROL) 0.5 MCG capsule Take 0.5 mcg by mouth daily.     hydrALAZINE  (APRESOLINE ) 25 MG tablet Take 25 mg by mouth 2 (two) times daily.     HYDROcodone -acetaminophen  (NORCO/VICODIN) 5-325 MG tablet Take 1 tablet by mouth every 6 (six) hours as needed for moderate pain (pain score 4-6). 20 tablet 0    metoprolol  succinate (TOPROL -XL) 100 MG 24 hr tablet Take 1 tablet (100 mg total) by mouth daily. 30 tablet 0   Multiple Vitamin (MULTIVITAMIN PO) Take 1 tablet by mouth daily.     rosuvastatin (CRESTOR) 10 MG tablet Take 10 mg by mouth daily.     sacubitril -valsartan  (ENTRESTO ) 49-51 MG Take 1 tablet by mouth 2 (two) times daily.     Semaglutide, 2 MG/DOSE, (OZEMPIC, 2 MG/DOSE,) 8 MG/3ML SOPN Inject 2 mg into the skin every Sunday.     sildenafil (VIAGRA) 100 MG tablet Take 100 mg by mouth daily as needed for erectile dysfunction.     torsemide (DEMADEX) 20 MG tablet Take 20 mg by mouth 2 (two) times daily.     No current facility-administered medications for this visit.    REVIEW OF SYSTEMS:   [X]  denotes positive finding, [ ]  denotes negative finding Cardiac  Comments:  Chest pain or chest pressure: ***   Shortness of breath upon exertion:    Short of breath when lying flat:    Irregular heart rhythm:    Constitutional    Fever or chills:      PHYSICAL EXAM:   There were no vitals filed for this visit.  GENERAL: The patient is a well-nourished male, in no acute distress. The vital signs are documented above. CARDIOVASCULAR: There  is a regular rate and rhythm. PULMONARY: Non-labored respirations ***  STUDIES:   ***   MEDICAL ISSUES:   ***  Malvina Serene CLORE, MD, FACS [facility] [phone] Pager [phone]

## 2024-04-13 ENCOUNTER — Ambulatory Visit: Admitting: Neurology

## 2024-04-18 ENCOUNTER — Ambulatory Visit: Attending: Student | Admitting: Student

## 2024-04-18 NOTE — Progress Notes (Deleted)
  Cardiology Office Note:   Date:  04/18/2024  ID:  [name], DOB [birth year], MRN [id number]  Primary Cardiologist: Lynwood Rakers, MD (Inactive) Electrophysiologist: Will Gladis Norton, MD   History of Present Illness:   Phillip Ruiz is a 53 y.o. male with h/o Chronic systolic CHF, HFpEF, CKD III, DM2, and HTN seen today for routine electrophysiology followup.   S/p Barostim gen change 12/2023 -> Revision 03/16/2024  Since last being seen in our clinic the patient reports doing ***.  he denies chest pain, palpitations, dyspnea, PND, orthopnea, nausea, vomiting, dizziness, syncope, edema, weight gain, or early satiety.     Review of systems complete and found to be negative unless listed in HPI.    EP Information / Studies Reviewed:    EKG is not ordered today. EKG from 04/04/2024 reviewed which showed NSR at 82 bpm  Arrhythmia/Device History Field seismologist ICD implanted 05/10/19 for Chronic systolic CHF  Barostim (BatWire) implanted 12/2020 for Chronic systolic CHF    Barostim Interrogation- Performed personally and reviewed in detail today,  See scanned report  ICD/PPM interrogation - Not performed. Managed elsewhere.    Physical Exam:   VS:  There were no vitals taken for this visit.   Wt Readings from Last 3 Encounters:  04/04/24 187 lb 3.2 oz (84.9 kg)  04/04/24 186 lb 8 oz (84.6 kg)  03/16/24 186 lb 1.1 oz (84.4 kg)     GEN: Well nourished, well developed in no acute distress NECK: No JVD; No carotid bruits CARDIAC: {EPRHYTHM:28826}, no murmurs, rubs, gallops RESPIRATORY:  Clear to auscultation without rales, wheezing or rhonchi  ABDOMEN: Soft, non-tender, non-distended EXTREMITIES:  {EDEMA LEVEL:28147::No} edema; No deformity   ASSESSMENT AND PLAN:    Chronic systolic CHF s/p Chief Financial Officer and Barostim implantation NYHA *** symptoms. ***  Device {Blank single:19197::titrated from *** millamp to *** milliamp by  increments of 0.4 with good blood pressure response and no stimulation.,programmed at *** for chronic settings}  Device impedence stable. Pt goals are to ***  Normal device function See scanned report. Will follow up in *** weeks to continue titration with goal of 6-8 milliamps for chronic settings.   HTN Stable on current regimen   Disposition:   Follow up with {EPPROVIDERS:28135::EP Team} in {EPFOLLOW UP:28173}  Signed, Ozell Prentice Passey, PA-C

## 2024-04-20 ENCOUNTER — Encounter: Payer: Self-pay | Admitting: Student

## 2024-05-23 ENCOUNTER — Ambulatory Visit: Admitting: Neurology

## 2024-05-23 ENCOUNTER — Encounter: Payer: Self-pay | Admitting: Neurology

## 2024-06-13 ENCOUNTER — Ambulatory Visit: Admitting: Surgery

## 2024-06-28 NOTE — Progress Notes (Unsigned)
" °  Cardiology Office Note:   Date:  06/29/2024  ID:  Phillip Ruiz, DOB 12/24/1970, MRN [id number]  Primary Cardiologist: Dr. [facility]) Electrophysiologist: Will Gladis Norton, MD   History of Present Illness:   Phillip Ruiz is a 53 y.o. male with h/o Chronic systolic CHF, HFpEF, CKD III, DM2, and HTN seen today for routine electrophysiology followup.   Since last being seen in our clinic the patient reports doing OK from a cardiac perspective. Lead revision improved neck mobility. Unfortunately, he had stroke and possible recurrence in October. Has slurred speech and drooling.  Working with PT/OT and Speech.  Sister here with him today.   Review of systems complete and found to be negative unless listed in HPI.    EP Information / Studies Reviewed:    EKG is not ordered today.   Arrhythmia/Device History Field Seismologist ICD implanted 05/10/19 for Chronic systolic CHF  Barostim (BatWire) implanted 12/2020 for Chronic systolic CHF S/p Barostim gen change 12/2023 S/p Barostim revision 03/16/2024   Barostim Interrogation- Performed personally and reviewed in detail today,  See scanned report  ICD/PPM interrogation - Followed elsewhere    Physical Exam:   VS:  BP (!) 122/92   Pulse 61   Ht 5' 8 (1.727 m)   Wt 169 lb (76.7 kg)   SpO2 97%   BMI 25.70 kg/m    Wt Readings from Last 3 Encounters:  06/29/24 169 lb (76.7 kg)  04/04/24 187 lb 3.2 oz (84.9 kg)  04/04/24 186 lb 8 oz (84.6 kg)     GEN: Well nourished, well developed in no acute distress NECK: No JVD; No carotid bruits CARDIAC: Regular rate and rhythm, no murmurs, rubs, gallops RESPIRATORY:  Clear to auscultation without rales, wheezing or rhonchi  ABDOMEN: Soft, non-tender, non-distended EXTREMITIES:  No edema; No deformity   ASSESSMENT AND PLAN:    Chronic systolic CHF s/p Chief Financial Officer and Barostim implantation NYHA II-III symptoms. Confounded by debility  now s/p CVA  Device programmed at 10.2 for chronic settings  Device impedence stable. Normal device function See scanned report.  HTN Stable on current regimen   CVA Hopefully he will continue to recover well in the coming months.   Disposition:   Follow up with Jodie in 12 months due to long distance travel.   Signed, Ozell Prentice Passey, PA-C st "

## 2024-06-29 ENCOUNTER — Ambulatory Visit: Attending: Internal Medicine | Admitting: Student

## 2024-06-29 ENCOUNTER — Encounter: Payer: Self-pay | Admitting: Student

## 2024-06-29 VITALS — BP 122/92 | HR 61 | Ht 68.0 in | Wt 169.0 lb

## 2024-06-29 DIAGNOSIS — Z95810 Presence of automatic (implantable) cardiac defibrillator: Secondary | ICD-10-CM

## 2024-06-29 DIAGNOSIS — I5022 Chronic systolic (congestive) heart failure: Secondary | ICD-10-CM | POA: Diagnosis not present

## 2024-06-29 DIAGNOSIS — I639 Cerebral infarction, unspecified: Secondary | ICD-10-CM

## 2024-06-29 DIAGNOSIS — I10 Essential (primary) hypertension: Secondary | ICD-10-CM

## 2024-06-29 NOTE — Patient Instructions (Signed)
 Medication Instructions:  No medication changes today. *If you need a refill on your cardiac medications before your next appointment, please call your pharmacy*  Lab Work: No labwork ordered today. If you have labs (blood work) drawn today and your tests are completely normal, you will receive your results only by: MyChart Message (if you have MyChart) OR A paper copy in the mail If you have any lab test that is abnormal or we need to change your treatment, we will call you to review the results.  Testing/Procedures: No testing ordered today  Follow-Up: At [facility], you and your health needs are our priority.  As part of our continuing mission to provide you with exceptional heart care, our providers are all part of one team.  This team includes your primary Cardiologist (physician) and Advanced Practice Providers or APPs (Physician Assistants and Nurse Practitioners) who all work together to provide you with the care you need, when you need it.  Your next appointment:   12 month(s)  Provider:   Ozell Jodie Passey, PA-C   We recommend signing up for the patient portal called MyChart.  Sign up information is provided on this After Visit Summary.  MyChart is used to connect with patients for Virtual Visits (Telemedicine).  Patients are able to view lab/test results, encounter notes, upcoming appointments, etc.  Non-urgent messages can be sent to your provider as well.   To learn more about what you can do with MyChart, go to [email or web address].

## 2024-07-05 ENCOUNTER — Encounter: Payer: Self-pay | Admitting: Neurology

## 2024-07-05 ENCOUNTER — Ambulatory Visit: Admitting: Neurology

## 2024-07-05 ENCOUNTER — Telehealth: Payer: Self-pay | Admitting: Neurology

## 2024-07-05 NOTE — Telephone Encounter (Signed)
 Patient has no showed 2 New Patient appointments with our office, 05/23/24 and 07/05/24. Ok for dismissal?

## 2024-07-07 ENCOUNTER — Encounter: Payer: Self-pay | Admitting: Neurology

## 2024-07-11 ENCOUNTER — Ambulatory Visit: Admitting: Surgery
